# Patient Record
Sex: Female | Born: 1995 | Hispanic: No | Marital: Single | Smoking: Never smoker
Health system: Southern US, Community
[De-identification: ages and names within clinical notes are randomized; demographics above are authoritative.]

## PROBLEM LIST (undated history)

## (undated) DIAGNOSIS — E282 Polycystic ovarian syndrome: Secondary | ICD-10-CM

## (undated) HISTORY — PX: APPENDECTOMY: SHX54

## (undated) HISTORY — PX: TONSILLECTOMY: SUR1361

## (undated) HISTORY — PX: WISDOM TOOTH EXTRACTION: SHX21

---
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
## 2016-10-10 ENCOUNTER — Ambulatory Visit: Payer: Self-pay | Admitting: Psychology

## 2016-11-25 ENCOUNTER — Encounter (HOSPITAL_COMMUNITY): Payer: Self-pay | Admitting: Emergency Medicine

## 2016-11-25 ENCOUNTER — Emergency Department (HOSPITAL_COMMUNITY): Payer: Self-pay

## 2016-11-25 ENCOUNTER — Other Ambulatory Visit: Payer: Self-pay

## 2016-11-25 ENCOUNTER — Emergency Department (HOSPITAL_COMMUNITY)
Admission: EM | Admit: 2016-11-25 | Discharge: 2016-11-25 | Disposition: A | Discharge status: Home / Self Care | Payer: Self-pay | Attending: Emergency Medicine | Admitting: Emergency Medicine

## 2016-11-25 DIAGNOSIS — I951 Orthostatic hypotension: Secondary | ICD-10-CM

## 2016-11-25 DIAGNOSIS — R55 Syncope and collapse: Secondary | ICD-10-CM | POA: Insufficient documentation

## 2016-11-25 DIAGNOSIS — E860 Dehydration: Secondary | ICD-10-CM

## 2016-11-25 HISTORY — DX: Polycystic ovarian syndrome: E28.2

## 2016-11-25 LAB — URINALYSIS, ROUTINE W REFLEX MICROSCOPIC
Bilirubin Urine: NEGATIVE
Glucose, UA: NEGATIVE mg/dL
HGB URINE DIPSTICK: NEGATIVE
Ketones, ur: NEGATIVE mg/dL
LEUKOCYTES UA: NEGATIVE
Nitrite: NEGATIVE
Protein, ur: NEGATIVE mg/dL
SPECIFIC GRAVITY, URINE: 1.013 (ref 1.005–1.030)
pH: 6 (ref 5.0–8.0)

## 2016-11-25 LAB — CBC
HCT: 40.1 % (ref 36.0–46.0)
HEMOGLOBIN: 13.3 g/dL (ref 12.0–15.0)
MCH: 28.4 pg (ref 26.0–34.0)
MCHC: 33.2 g/dL (ref 30.0–36.0)
MCV: 85.7 fL (ref 78.0–100.0)
Platelets: 258 10*3/uL (ref 150–400)
RBC: 4.68 MIL/uL (ref 3.87–5.11)
RDW: 12.6 % (ref 11.5–15.5)
WBC: 8.9 10*3/uL (ref 4.0–10.5)

## 2016-11-25 LAB — BASIC METABOLIC PANEL
ANION GAP: 9 (ref 5–15)
BUN: 11 mg/dL (ref 6–20)
CALCIUM: 9.1 mg/dL (ref 8.9–10.3)
CHLORIDE: 104 mmol/L (ref 101–111)
CO2: 25 mmol/L (ref 22–32)
Creatinine, Ser: 0.72 mg/dL (ref 0.44–1.00)
GFR calc non Af Amer: >60 mL/min (ref >60)
Glucose, Bld: 96 mg/dL (ref 65–99)
Potassium: 4.1 mmol/L (ref 3.5–5.1)
Sodium: 138 mmol/L (ref 135–145)

## 2016-11-25 LAB — PREGNANCY, URINE: PREG TEST UR: NEGATIVE

## 2016-11-25 LAB — CBG MONITORING, ED: GLUCOSE-CAPILLARY: 88 mg/dL (ref 65–99)

## 2016-11-25 NOTE — ED Triage Notes (Signed)
To ED via [facility] with c/o syncopal episode at work-- witnessed -- hit head-- LOC for "short time" -- pt ambulatory to ED -- alert/oriented x 4.

## 2016-11-25 NOTE — ED Provider Notes (Addendum)
[facility] Provider Note   CSN: [id number] Arrival date & time: 11/25/16  0916     History   Chief Complaint Chief Complaint  Patient presents with  . Loss of Consciousness    HPI Tracey Hernandez is a 21 y.o. female.  The history is provided by the patient. No language interpreter was used.  Loss of Consciousness     Tracey Hernandez is a [age] y.o. female who presents to the Emergency Department complaining of syncope.  Work today when she began to feel unwell and passed out, falling to the ground and striking her head on the concrete. She states that she hit the ground hard and had very brief loss of consciousness. She reports a general feeling of unwellness as well as headache. She has had headaches over the last several days that are similar to her prior headaches but slightly more intense in nature. They have been significant enough for her to take ibuprofen at home. She denies any fevers, chest pain, shortness of breath, vomiting, leg swelling or pain. She does have a history of PCO S and has some mild lower abdominal cramping but she just finished her cycle and this is normal for her. She denies any other medical problems. She does take birth control. She did not eat. She was worked today, but this is typical for her. Past Medical History:  Diagnosis Date  . PCOS (polycystic ovarian syndrome)     There are no active problems to display for this patient.   Past Surgical History:  Procedure Laterality Date  . APPENDECTOMY    . TONSILLECTOMY      OB History    No data available       Home Medications    Prior to Admission medications   Not on File    Family History No family history on file.  Social History Social History  Substance Use Topics  . Smoking status: Never Smoker  . Smokeless tobacco: Never Used  . Alcohol use Yes     Comment: socially     Allergies   Patient has no allergy information on record.   Review of Systems Review of Systems    Cardiovascular: Positive for syncope.  All other systems reviewed and are negative.    Physical Exam Updated Vital Signs BP 134/78 (BP Location: Right Arm)   Pulse 64   Temp 98.2 F (36.8 C) (Oral)   Resp 16   Ht 5\' 4"  (1.626 m)   Wt 79.4 kg (175 lb)   LMP 11/24/2016   SpO2 100%   BMI 30.04 kg/m   Physical Exam  Constitutional: She is oriented to person, place, and time. She appears well-developed and well-nourished.  HENT:  Head: Normocephalic and atraumatic.  Eyes: Pupils are equal, round, and reactive to light. EOM are normal.  Cardiovascular: Normal rate and regular rhythm.   No murmur heard. Pulmonary/Chest: Effort normal and breath sounds normal. No respiratory distress.  Abdominal: Soft. There is no rebound and no guarding.  Mild suprapubic tenderness  Musculoskeletal: She exhibits no edema or tenderness.  Neurological: She is alert and oriented to person, place, and time. No cranial nerve deficit. Coordination normal.  5/5 strength in all four extremities.    Skin: Skin is warm and dry.  Psychiatric: She has a normal mood and affect. Her behavior is normal.  Nursing note and vitals reviewed.    ED Treatments / Results  Labs (all labs ordered are listed, but only abnormal results are displayed)  Labs Reviewed  BASIC METABOLIC PANEL  CBC  URINALYSIS, ROUTINE W REFLEX MICROSCOPIC  PREGNANCY, URINE  CBG MONITORING, ED    EKG  EKG Interpretation  Date/Time:  Monday November 25 2016 09:12:20 EDT Ventricular Rate:  66 PR Interval:  154 QRS Duration: 88 QT Interval:  416 QTC Calculation: 436 R Axis:   90 Text Interpretation:  Normal sinus rhythm with sinus arrhythmia Rightward axis Borderline ECG Confirmed by Tilden Fossa [phone]) on 11/25/2016 12:14:46 PM       Radiology Ct Head Wo Contrast  Result Date: 11/25/2016 CLINICAL DATA:  Headache posttraumatic.  Fall today. EXAM: CT HEAD WITHOUT CONTRAST TECHNIQUE: Contiguous axial images were obtained from  the base of the skull through the vertex without intravenous contrast. COMPARISON:  None. FINDINGS: Brain: No evidence of acute infarction, hemorrhage, hydrocephalus, extra-axial collection or mass lesion/mass effect. Vascular: No hyperdense vessel or unexpected calcification. Skull: Negative Sinuses/Orbits: Negative Other: None IMPRESSION: Normal CT head Electronically Signed   By: Marlan Palau M.D.   On: 11/25/2016 13:19    Procedures Procedures (including critical care time)  Medications Ordered in ED Medications - No data to display   Initial Impression / Assessment and Plan / ED Course  I have reviewed the triage vital signs and the nursing notes.  Pertinent labs & imaging results that were available during my care of the patient were reviewed by me and considered in my medical decision making (see chart for details).     Patient here for evaluation following a syncopal event. She is nontoxic appearing on examination with no focal neurologic deficits. CT head obtained given patient's history of new onset headaches as well as head injury today. Presentation is not consistent with subarachnoid hemorrhage, cardiogenic syncope, PE.   She is orthostatic in the department. She declines IV fluids at this time and prefers oral fluid hydration at home. She did not have any chest pain or shortness of breath with her orthostatic vital signs but did feel fatigued. Counseled patient on oral fluid hydration, rest.  Counseled patient on home care, outpatient follow up and return precautions.   Final Clinical Impressions(s) / ED Diagnoses   Final diagnoses:  Syncope and collapse    New Prescriptions New Prescriptions   No medications on file     Tilden Fossa, MD 11/25/16 1336    Tilden Fossa, MD 11/25/16 1353

## 2016-11-25 NOTE — ED Notes (Signed)
Pt verbalized understanding discharge instructions and denies any further needs or questions at this time. VS stable, ambulatory and steady gait.   

## 2016-12-11 ENCOUNTER — Encounter: Payer: Self-pay | Admitting: Physician Assistant

## 2016-12-11 ENCOUNTER — Ambulatory Visit: Payer: Worker's Compensation | Attending: Internal Medicine | Admitting: Physician Assistant

## 2016-12-11 VITALS — BP 103/62 | HR 65 | Temp 98.5°F | Wt 180.0 lb

## 2016-12-11 DIAGNOSIS — E559 Vitamin D deficiency, unspecified: Secondary | ICD-10-CM

## 2016-12-11 DIAGNOSIS — R059 Cough, unspecified: Secondary | ICD-10-CM

## 2016-12-11 DIAGNOSIS — R05 Cough: Secondary | ICD-10-CM | POA: Insufficient documentation

## 2016-12-11 DIAGNOSIS — E282 Polycystic ovarian syndrome: Secondary | ICD-10-CM | POA: Insufficient documentation

## 2016-12-11 MED ORDER — FLUCONAZOLE 150 MG PO TABS: 150.0000 mg | ORAL_TABLET | Freq: Once | ORAL | 0 refills | Status: AC

## 2016-12-11 MED ORDER — AZITHROMYCIN 250 MG PO TABS: ORAL_TABLET | ORAL | 0 refills | Status: DC

## 2016-12-11 MED FILL — AZITHROMYCIN 250 MG TABLET: 250 | 5 days supply | Qty: 6 | Fill #0

## 2016-12-11 MED FILL — FLUCONAZOLE 150 MG TABLET: 150 | 1 days supply | Qty: 1 | Fill #0

## 2016-12-11 NOTE — Progress Notes (Signed)
Patient ID: Tracey Hernandez, female   DOB: February 03, 1996, 21 y.o.   MRN: [id number]       Tracey Hernandez, is a 21 y.o. female  NWG:[id number]  [id number]  DOB - May 06, 1995  Subjective:  Chief Complaint and HPI: Tracey Hernandez is a 21 y.o. female here today to establish care and for a follow up visit After being seen in the ED 11/25/2016 for syncope.  EKG did not show acute ischemia.    CBC, BMP, glucose and UA WNL.  Pregnancy was negative.    Today she presents without further episodes and has been feeling fine other than a persistent cough that developed after her ED visit.  Cough is non-productive and worse at night.  She feels fine otherwise. No f/c  She takes OCP for PCOS.   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  Had work-up at another clinic about 2-3 months ago.  TSH was normal.  Vitamin D was slightly low.    From ED note: Tracey Hernandez is a 21 y.o. female who presents to the Emergency Department complaining of syncope.  Work today when she began to feel unwell and passed out, falling to the ground and striking her head on the concrete. She states that she hit the ground hard and had very brief loss of consciousness. She reports a general feeling of unwellness as well as headache. She has had headaches over the last several days that are similar to her prior headaches but slightly more intense in nature. They have been significant enough for her to take ibuprofen at home. She denies any fevers, chest pain, shortness of breath, vomiting, leg swelling or pain. She does have a history of PCO S and has some mild lower abdominal cramping but she just finished her cycle and this is normal for her. She denies any other medical problems. She does take birth control. She did not eat. She was worked today, but this is typical for her.  CT head obtained given patient's history of new onset headaches as well as head injury today. Presentation is not consistent with subarachnoid  hemorrhage, cardiogenic syncope, PE.   She was orthostatic but refused IVF. Marland Kitchen   ED/Hospital notes reviewed.   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 EENT:  No vision changes, No blurry vision, No hearing changes. No mouth, throat, or ear problems.  Respiratory: No cough, No SOB Cardiac: No CP, no palpitations GI:  No abd pain, No N/V/D. GU: No Urinary s/sx Musculoskeletal: No joint pain Neuro: No headache, no dizziness, no motor weakness.  Skin: No rash Endocrine:  No polydipsia. No polyuria.  Psych: Denies SI/HI  No problems updated.  ALLERGIES: No Known Allergies  PAST MEDICAL HISTORY: Past Medical History:  Diagnosis Date  . PCOS (polycystic ovarian syndrome)     MEDICATIONS AT HOME: Prior to Admission medications   Medication Sig Start Date End Date Taking? Authorizing Provider  azithromycin (ZITHROMAX) 250 MG tablet Take 2 tabs day then 1 tab daily 12/11/16   Georgian Co M, PA-C  fluconazole (DIFLUCAN) 150 MG tablet Take 1 tablet (150 mg total) by mouth once. 12/11/16 12/11/16  Anders Simmonds, PA-C     Objective:  EXAM:   Vitals:   12/11/16 1507  BP: 103/62  Pulse: 65  Temp: 98.5 F (36.9 C)  TempSrc: Oral  Weight: 180 lb (81.6  kg)    General appearance : A&OX3. NAD. Non-toxic-appearing HEENT: Atraumatic and Normocephalic.  PERRLA. EOM intact.  TM clear B. Mouth-MMM, post pharynx WNL w/o erythema, No PND. Neck: supple, no JVD. No cervical lymphadenopathy. No thyromegaly Chest/Lungs:  Breathing-non-labored, Good air entry bilaterally, breath sounds normal without rales, rhonchi, or wheezing  CVS: S1 S2 regular, no murmurs, gallops, rubs  Extremities: Bilateral Lower Ext shows no edema, both legs are warm to touch with = pulse throughout Neurology:  CN II-XII grossly intact, Non focal.   Psych:  TP linear. J/I WNL. Normal speech. Appropriate eye contact and  affect.  Skin:  No Rash  Data Review No results found for: HGBA1C   Assessment & Plan   1. Cough Cover for atypicals due to length of cough - azithromycin (ZITHROMAX) 250 MG tablet; Take 2 tabs day then 1 tab daily  Dispense: 6 tablet; Refill: 0 - fluconazole (DIFLUCAN) 150 MG tablet; Take 1 tablet (150 mg total) by mouth once.  Dispense: 1 tablet; Refill: 0  2. Vitamin D deficiency 2000 units vitamin D daily Recheck level in 10 weeks (level was checked elsewhere a couple of months ago and was reported as "low."  She was advised to get more sun.     Patient have been counseled extensively about nutrition and exercise  Return in about 10 weeks (around 02/19/2017) for assign PCP; check vitamin d level.  The patient was given clear instructions to go to ER or return to medical center if symptoms don't improve, worsen or new problems develop. The patient verbalized understanding. The patient was told to call to get lab results if they haven't heard anything in the next week.     Georgian Co, PA-C [facility] [place], [place] [phone]   12/11/2016, 3:18 PM

## 2016-12-11 NOTE — Patient Instructions (Signed)
Vitamin D 2000 units daily.

## 2016-12-27 ENCOUNTER — Ambulatory Visit (HOSPITAL_COMMUNITY)
Admission: EM | Admit: 2016-12-27 | Discharge: 2016-12-27 | Disposition: A | Discharge status: Home / Self Care | Payer: Self-pay | Attending: Family | Admitting: Family

## 2016-12-27 ENCOUNTER — Encounter (HOSPITAL_COMMUNITY): Payer: Self-pay | Admitting: Family Medicine

## 2016-12-27 DIAGNOSIS — R05 Cough: Secondary | ICD-10-CM | POA: Insufficient documentation

## 2016-12-27 DIAGNOSIS — J069 Acute upper respiratory infection, unspecified: Secondary | ICD-10-CM

## 2016-12-27 DIAGNOSIS — B9789 Other viral agents as the cause of diseases classified elsewhere: Secondary | ICD-10-CM

## 2016-12-27 DIAGNOSIS — R0981 Nasal congestion: Secondary | ICD-10-CM

## 2016-12-27 DIAGNOSIS — E282 Polycystic ovarian syndrome: Secondary | ICD-10-CM | POA: Insufficient documentation

## 2016-12-27 DIAGNOSIS — H9203 Otalgia, bilateral: Secondary | ICD-10-CM | POA: Insufficient documentation

## 2016-12-27 DIAGNOSIS — J029 Acute pharyngitis, unspecified: Secondary | ICD-10-CM | POA: Insufficient documentation

## 2016-12-27 MED ORDER — BENZONATATE 100 MG PO CAPS: 100.0000 mg | ORAL_CAPSULE | Freq: Two times a day (BID) | ORAL | 0 refills | Status: DC

## 2016-12-27 NOTE — Discharge Instructions (Signed)
Suspect viral respiratory infection  Mucinex as discussed  Increase intake of clear fluids. Congestion is best treated by hydration, when mucus is wetter, it is thinner, less sticky, and easier to expel from the body, either through coughing up drainage, or by blowing your nose.   Get plenty of rest.   Use saline nasal drops and blow your nose frequently. Run a humidifier at night and elevate the head of the bed. Vicks Vapor rub will help with congestion and cough. Steam showers and sinus massage for congestion.   Use Acetaminophen or Ibuprofen as needed for fever or pain. Avoid second hand smoke. Even the smallest exposure will worsen symptoms.   Over the counter medications you can try include Delsym for cough, a decongestant for congestion, and Mucinex or Robitussin as an expectorant. Be sure to just get the plain Mucinex or Robitussin that just has one medication (Guaifenesen). We don't recommend the combination products. Note, be sure to drink two glasses of water with each dose of Mucinex as the medication will not work well without adequate hydration.   You can also try a teaspoon of honey to see if this will help reduce cough. Throat lozenges can sometimes be beneficial as well.    This illness will typically last 7 - 10 days.   Please follow up with our clinic if you develop a fever greater than 101 F, symptoms worsen, or do not resolve in the next week.

## 2016-12-27 NOTE — ED Provider Notes (Signed)
[facility]    CSN: [id number] Arrival date & time: 12/27/16  1557     History   Chief Complaint Chief Complaint  Patient presents with  . Sore Throat  . Cough  . Headache    HPI Tracey Hernandez is a 21 y.o. female.   CC: sore throat, cough x 3 days, unchanged,   Endorses headache, sinus pain, bilateral ear pain. Feels SOB during bad coughing spell. No SOB right now.  Tried ibuprofen with some relief. Describes HA as pressure, 'not worst HA of life.'   No Fever, chills, wheezing, CP  No sick contacts, recent travel.   No asthma. Non smoker  Works in [employer].        8/22 seen at [facility] for cough and given azithromycin, with resolve.   Past Medical History:  Diagnosis Date  . PCOS (polycystic ovarian syndrome)     Patient Active Problem List   Diagnosis Date Noted  . PCOS (polycystic ovarian syndrome) 07/05/2011    Past Surgical History:  Procedure Laterality Date  . APPENDECTOMY    . TONSILLECTOMY      OB History    No data available       Home Medications    Prior to Admission medications   Medication Sig Start Date End Date Taking? Authorizing Provider  azithromycin (ZITHROMAX) 250 MG tablet Take 2 tabs day then 1 tab daily 12/11/16   Georgian Co M, PA-C  benzonatate (TESSALON PERLES) 100 MG capsule Take 1 capsule (100 mg total) by mouth 2 (two) times daily. 12/27/16   Allegra Grana, FNP    Family History History reviewed. No pertinent family history.  Social History Social History  Substance Use Topics  . Smoking status: Never Smoker  . Smokeless tobacco: Never Used  . Alcohol use Yes     Comment: socially     Allergies   Patient has no known allergies.   Review of Systems Review of Systems  Constitutional: Negative for chills and fever.  HENT: Positive for sore throat.   Respiratory: Positive for cough.   Cardiovascular: Negative for chest pain and palpitations.  Gastrointestinal: Negative for nausea  and vomiting.  Neurological: Positive for headaches.     Physical Exam Triage Vital Signs ED Triage Vitals  Enc Vitals Group     BP 12/27/16 1640 121/66     Pulse Rate 12/27/16 1640 60     Resp 12/27/16 1640 18     Temp 12/27/16 1640 98.5 F (36.9 C)     Temp src --      SpO2 12/27/16 1640 100 %     Weight --      Height --      Head Circumference --      Peak Flow --      Pain Score 12/27/16 1641 6     Pain Loc --      Pain Edu? --      Excl. in [facility]? --    No data found.   Updated Vital Signs BP 121/66   Pulse 60   Temp 98.5 F (36.9 C)   Resp 18   SpO2 100%   Visual Acuity Right Eye Distance:   Left Eye Distance:   Bilateral Distance:    Right Eye Near:   Left Eye Near:    Bilateral Near:     Physical Exam  Constitutional: She appears well-developed and well-nourished.  HENT:  Head: Normocephalic and atraumatic.  Right Ear: Hearing, tympanic  membrane, external ear and ear canal normal. No drainage, swelling or tenderness. No foreign bodies. Tympanic membrane is not erythematous and not bulging. No middle ear effusion. No decreased hearing is noted.  Left Ear: Hearing, tympanic membrane, external ear and ear canal normal. No drainage, swelling or tenderness. No foreign bodies. Tympanic membrane is not erythematous and not bulging.  No middle ear effusion. No decreased hearing is noted.  Nose: Nose normal. No rhinorrhea. Right sinus exhibits no maxillary sinus tenderness and no frontal sinus tenderness. Left sinus exhibits no maxillary sinus tenderness and no frontal sinus tenderness.  Mouth/Throat: Uvula is midline, oropharynx is clear and moist and mucous membranes are normal. No oropharyngeal exudate, posterior oropharyngeal edema, posterior oropharyngeal erythema or tonsillar abscesses.  Eyes: Conjunctivae are normal.  Cardiovascular: Regular rhythm, normal heart sounds and normal pulses.   Pulmonary/Chest: Effort normal and breath sounds normal. She has no  wheezes. She has no rhonchi. She has no rales.  Lymphadenopathy:       Head (right side): No submental, no submandibular, no tonsillar, no preauricular, no posterior auricular and no occipital adenopathy present.       Head (left side): No submental, no submandibular, no tonsillar, no preauricular, no posterior auricular and no occipital adenopathy present.    She has no cervical adenopathy.  Neurological: She is alert.  Skin: Skin is warm and dry.  Psychiatric: She has a normal mood and affect. Her speech is normal and behavior is normal. Thought content normal.  Vitals reviewed.    UC Treatments / Results  Labs (all labs ordered are listed, but only abnormal results are displayed) Labs Reviewed - No data to display  EKG  EKG Interpretation None       Radiology No results found.  Procedures Procedures (including critical care time)  Medications Ordered in UC Medications - No data to display   Initial Impression / Assessment and Plan / UC Course  I have reviewed the triage vital signs and the nursing notes.  Pertinent labs & imaging results that were available during my care of the patient were reviewed by me and considered in my medical decision making (see chart for details).       Final Clinical Impressions(s) / UC Diagnoses   Final diagnoses:  Sinus congestion  Viral URI with cough   Working diagnosis of viral URI x 3 days. Well appearing. No acute respiratory distress. Afebrile. Strep negative.Patient and I agreed upon conservative therapy at this time with symptom management , close observation, and delayed antibiotic treatment. Advised mucinex, tessalon PRN.   Return precautions given.    New Prescriptions New Prescriptions   BENZONATATE (TESSALON PERLES) 100 MG CAPSULE    Take 1 capsule (100 mg total) by mouth 2 (two) times daily.     Controlled Substance Prescriptions [place] Controlled Substance Registry consulted? Not Applicable   Allegra Granarnett, Arlana Canizales  G, FNP 12/27/16 1758

## 2016-12-30 LAB — CULTURE, GROUP A STREP (THRC)

## 2017-01-02 ENCOUNTER — Encounter (HOSPITAL_COMMUNITY): Payer: Self-pay | Admitting: Family Medicine

## 2017-01-02 ENCOUNTER — Ambulatory Visit (HOSPITAL_COMMUNITY)
Admission: EM | Admit: 2017-01-02 | Discharge: 2017-01-02 | Disposition: A | Discharge status: Home / Self Care | Payer: Self-pay | Attending: Family Medicine | Admitting: Family Medicine

## 2017-01-02 ENCOUNTER — Ambulatory Visit (INDEPENDENT_AMBULATORY_CARE_PROVIDER_SITE_OTHER): Payer: Self-pay

## 2017-01-02 DIAGNOSIS — R05 Cough: Secondary | ICD-10-CM

## 2017-01-02 DIAGNOSIS — R059 Cough, unspecified: Secondary | ICD-10-CM

## 2017-01-02 MED ORDER — PROMETHAZINE-DM 6.25-15 MG/5ML PO SYRP: 5.0000 mL | ORAL_SOLUTION | Freq: Four times a day (QID) | ORAL | 0 refills | Status: DC | PRN

## 2017-01-02 NOTE — ED Triage Notes (Signed)
Pt here for URI sputums. sts she was seen here last week and  Her congestion is better but still has a hacking cough.

## 2017-01-02 NOTE — ED Provider Notes (Signed)
  [facility]   [id number] 01/02/17 Arrival Time: 1312  ASSESSMENT & PLAN:  1. Cough     Meds ordered this encounter  Medications  . promethazine-dextromethorphan (PROMETHAZINE-DM) 6.25-15 MG/5ML syrup    Sig: Take 5 mLs by mouth 4 (four) times daily as needed for cough.    Dispense:  118 mL    Refill:  0    OTC analgesics and symptom care as needed. Will f/u in 1-2 weeks if not improving. Question allergy related. Does reports sneezing more. Occasional itchy eyes. Will try OTC allergy medication in addition.  Reviewed expectations re: course of current medical issues. Questions answered. Outlined signs and symptoms indicating need for more acute intervention. Patient verbalized understanding. After Visit Summary given.   SUBJECTIVE:  Tracey Hernandez is a 21 y.o. female who presents with complaint of persistent cough. Cold symptoms on/off for a couple of weeks. Treated with Zithromax and Tessalon Perles. Now with hacking cough, worse at night. Affecting sleep. Nausea associated with continued coughing. Slightly decreased PO intake. No emesis. Afebrile. Non-smoker. No OTC treatment. No specific aggravating or alleviating factors reported.  ROS: As per HPI.   OBJECTIVE:  Vitals:   01/02/17 1421  BP: 122/84  Pulse: 60  Resp: 18  Temp: 98.6 F (37 C)  SpO2: 98%     General appearance: alert; no distress HEENT: nasal congestion, mild Neck: supple without LAD Lungs: clear to auscultation bilaterally; dry cough Skin: warm and dry Psychological: alert and cooperative; normal mood and affect   Dg Chest 2 View  Result Date: 01/02/2017 CLINICAL DATA:  Cough and congestion, fever for 3-7 days. History of pneumonia and bronchitis. EXAM: CHEST  2 VIEW COMPARISON:  None. FINDINGS: Cardiomediastinal silhouette is normal. No pleural effusions or focal consolidations. Trachea projects midline and there is no pneumothorax. Soft tissue planes and included osseous structures are  non-suspicious. IMPRESSION: Normal chest. Electronically Signed   By: Awilda Metroourtnay  Bloomer M.D.   On: 01/02/2017 15:01    No Known Allergies  Past Medical History:  Diagnosis Date  . PCOS (polycystic ovarian syndrome)    Social History   Social History  . Marital status: Single    Spouse name: N/A  . Number of children: N/A  . Years of education: N/A   Occupational History  . Not on file.   Social History Main Topics  . Smoking status: Never Smoker  . Smokeless tobacco: Never Used  . Alcohol use Yes     Comment: socially  . Drug use: No  . Sexual activity: Not on file   Other Topics Concern  . Not on file   Social History Narrative  . No narrative on file           Mardella LaymanHagler, [name], MD 01/02/17 [id number]

## 2017-03-17 ENCOUNTER — Encounter: Payer: Self-pay | Admitting: Family Medicine

## 2017-03-17 ENCOUNTER — Ambulatory Visit: Payer: Self-pay | Attending: Family Medicine | Admitting: Family Medicine

## 2017-03-17 ENCOUNTER — Ambulatory Visit: Payer: Medicaid Other | Attending: Internal Medicine | Admitting: Licensed Clinical Social Worker

## 2017-03-17 VITALS — BP 105/64 | HR 67 | Temp 98.7°F | Resp 18 | Ht 64.0 in | Wt 179.6 lb

## 2017-03-17 DIAGNOSIS — F329 Major depressive disorder, single episode, unspecified: Secondary | ICD-10-CM | POA: Insufficient documentation

## 2017-03-17 DIAGNOSIS — E559 Vitamin D deficiency, unspecified: Secondary | ICD-10-CM

## 2017-03-17 DIAGNOSIS — Z8659 Personal history of other mental and behavioral disorders: Secondary | ICD-10-CM

## 2017-03-17 DIAGNOSIS — L42 Pityriasis rosea: Secondary | ICD-10-CM

## 2017-03-17 DIAGNOSIS — F4310 Post-traumatic stress disorder, unspecified: Secondary | ICD-10-CM | POA: Insufficient documentation

## 2017-03-17 DIAGNOSIS — Z09 Encounter for follow-up examination after completed treatment for conditions other than malignant neoplasm: Secondary | ICD-10-CM

## 2017-03-17 DIAGNOSIS — F419 Anxiety disorder, unspecified: Secondary | ICD-10-CM | POA: Insufficient documentation

## 2017-03-17 DIAGNOSIS — F339 Major depressive disorder, recurrent, unspecified: Secondary | ICD-10-CM

## 2017-03-17 MED ORDER — HYDROXYZINE HCL 25 MG PO TABS: 25.0000 mg | ORAL_TABLET | Freq: Three times a day (TID) | ORAL | 0 refills | Status: DC | PRN

## 2017-03-17 MED ORDER — TRIAMCINOLONE ACETONIDE 0.025 % EX OINT: 1.0000 | TOPICAL_OINTMENT | Freq: Two times a day (BID) | CUTANEOUS | 1 refills | Status: DC

## 2017-03-17 NOTE — BH Specialist Note (Signed)
Integrated Behavioral Health Initial Visit  MRN: [id number] Name: Tracey Hernandez Fedele  Number of Integrated Behavioral Health Clinician visits:: 1/6 Session Start time: 4:15 PM  Session End time: 4:45 PM Total time: 30 minutes  Type of Service: Integrated Behavioral Health- Individual/Family Interpretor:No. Interpretor Name and Language: N.A.   Warm Hand Off Completed.       SUBJECTIVE: Tracey Hernandez is a 21 y.o. female accompanied by self Patient was referred by FNP Hairston for depression and anxiety. Patient reports the following symptoms/concerns: overwhelming feelings of sadness and worry, difficulty sleeping, low energy, decreased concentration, irritability, decreased appetite, panic attacks, and hx of suicidal ideations Duration of problem: Ongoing Pt reports 14 years; Severity of problem: severe  OBJECTIVE: Mood: Dysphoric and Affect: Depressed Risk of harm to self or others: No plan to harm self or others Pt has hx of suicidal ideations; however, denies current SI/HI/AVH.   LIFE CONTEXT: Family and Social: Pt receives support from partner. She states that she is trying to become more social and plays on a Quidditch team (currently off season) School/Work: Pt is a full time Consulting civil engineerstudent at [employer]. She has a P/T job Self-Care: Pt participated in a Friendsgiving celebration. She states that she drinks alcohol socially, denies any additional substance use Life Changes: Pt experiences panic attacks 2-3 x weekly. She has history of severe anxiety and depression  GOALS ADDRESSED: Patient will: 1. Reduce symptoms of: anxiety and depression 2. Increase knowledge and/or ability of: coping skills  3. Demonstrate ability to: Increase adequate support systems for patient/family and Increase motivation to adhere to plan of care  INTERVENTIONS: Interventions utilized: Mindfulness or Relaxation Training and Supportive Counseling  Standardized Assessments completed: GAD-7 and PHQ 2&9 with  C-SSRS  ASSESSMENT: Patient currently experiencing anxiety and depression. She reports overwhelming feelings of sadness and worry, difficulty sleeping, low energy, decreased concentration, irritability, decreased appetite, panic attacks, and hx of suicidal ideations. She denies current SI/HI/AVH. Pt identified protective factors and was provided a safety plan to complete in case of crisis. She receives emotional support from partner.   [facility]. She has missed two appointments this month. LCSWA discussed benefits of application of mindfulness interventions and taught pt grounding exercises to decrease symptoms. Pt was provided resources on emotional well-being applications and crisis intervention.  PLAN: 1. Follow up with behavioral health clinician on : Pt was encouraged to contact LCSWA if symptoms worsen or fail to improve to [facility] 2. Behavioral recommendations: LCSWA recommends that pt apply healthy coping skills discussed, continue to comply with psychotherapy, and utilize provided resources. Pt is encouraged to schedule follow up appointment with LCSWA 3. Referral(s): Community Mental Health Services (LME/Outside Clinic) 4. "From scale of 1-10, how likely are you to follow plan?": 7/10  Bridgett LarssonJasmine D Lewis, LCSW 03/19/17 4:22 PM

## 2017-03-17 NOTE — Progress Notes (Deleted)
   Subjective:  Patient ID: Genevive BiMindy Lienhard, female    DOB: Dec 05, 1995  Age: 21 y.o. MRN: [id number]  CC: Establish Care   HPI Noele Twaddell presents for        Outpatient Medications Prior to Visit  Medication Sig Dispense Refill  . azithromycin (ZITHROMAX) 250 MG tablet Take 2 tabs day then 1 tab daily 6 tablet 0  . benzonatate (TESSALON PERLES) 100 MG capsule Take 1 capsule (100 mg total) by mouth 2 (two) times daily. 30 capsule 0  . promethazine-dextromethorphan (PROMETHAZINE-DM) 6.25-15 MG/5ML syrup Take 5 mLs by mouth 4 (four) times daily as needed for cough. 118 mL 0   No facility-administered medications prior to visit.     ROS Review of Systems  Review of Systems - {ros [id number]}    Objective:  BP 105/64 (BP Location: Left Arm, Patient Position: Sitting, Cuff Size: Normal)   Pulse 67   Temp 98.7 F (37.1 C) (Oral)   Resp 18   Ht 5\' 4"  (1.626 m)   Wt 179 lb 9.6 oz (81.5 kg)   SpO2 98%   BMI 30.83 kg/m   BP/Weight 03/17/2017 01/02/2017 12/27/2016  Systolic BP 105 122 121  Diastolic BP 64 84 66  Wt. (Lbs) 179.6 - -  BMI 30.83 - -     Physical Exam   Assessment & Plan:   Problem List Items Addressed This Visit    None      No orders of the defined types were placed in this encounter.   Follow-up: No Follow-up on file.   Lizbeth BarkMandesia R Esten Dollar FNP

## 2017-03-17 NOTE — Patient Instructions (Signed)
Pityriasis Rosea Pityriasis rosea is a rash that usually appears on the trunk of the body. It may also appear on the upper arms and upper legs. It usually begins as a single patch, and then more patches begin to develop. The rash may cause mild itching, but it normally does not cause other problems. It usually goes away without treatment. However, it may take weeks or months for the rash to go away completely. What are the causes? The cause of this condition is not known. The condition does not spread from person to person (is noncontagious). What increases the risk? This condition is more likely to develop in young adults and children. It is most common in the spring and fall. What are the signs or symptoms? The main symptom of this condition is a rash.  The rash usually begins with a single oval patch that is larger than the ones that follow. This is called a herald patch. It generally appears a week or more before the rest of the rash appears.  When more patches start to develop, they spread quickly on the trunk, back, and arms. These patches are smaller than the first one.  The patches that make up the rash are usually oval-shaped and pink or red in color. They are usually flat, but they may sometimes be raised so that they can be felt with a finger. They may also be finely crinkled and have a scaly ring around the edge.  The rash does not typically appear on areas of the skin that are exposed to the sun.  Most people who have this condition do not have other symptoms, but some have mild itching. In a few cases, a mild headache or body aches may occur before the rash appears and then go away. How is this diagnosed? Your health care provider may diagnose this condition by doing a physical exam and taking your medical history. To rule out other possible causes for the rash, the health care provider may order blood tests or take a skin sample from the rash to be looked at under a microscope. How  is this treated? Usually, treatment is not needed for this condition. The rash will probably go away on its own in 4-8 weeks. In some cases, a health care provider may recommend or prescribe medicine to reduce itching. Follow these instructions at home:  Take medicines only as directed by your health care provider.  Avoid scratching the affected areas of skin.  Do not take hot baths or use a sauna. Use only warm water when bathing or showering. Heat can increase itching. Contact a health care provider if:  Your rash does not go away in 8 weeks.  Your rash gets much worse.  You have a fever.  You have swelling or pain in the rash area.  You have fluid, blood, or pus coming from the rash area. This information is not intended to replace advice given to you by your health care provider. Make sure you discuss any questions you have with your health care provider. Document Released: 05/15/2001 Document Revised: 09/14/2015 Document Reviewed: 03/16/2014 Elsevier Interactive Patient Education  2018 Elsevier Inc.  

## 2017-03-17 NOTE — Progress Notes (Signed)
Patient is here for establish care   Patient complains about spot itchy  around body & chest

## 2017-03-18 LAB — VITAMIN D 25 HYDROXY (VIT D DEFICIENCY, FRACTURES): Vit D, 25-Hydroxy: 23.1 ng/mL — ABNORMAL LOW (ref 30.0–100.0)

## 2017-03-19 NOTE — Progress Notes (Signed)
Subjective:  Patient ID: Tracey BiMindy Mobley, female    DOB: [birth year]  Age: 21 y.o. MRN: [id number]  CC: Establish Care   HPI Tracey Hernandez presents for anxiety and depression follow-up.  She reports history of severe anxiety and depression as well as PTSD.    Symptoms include anhedonia, depressed mood, racing thoughts, and difficulty concentrating. She reports receiving professional treatment for symptoms for 14 years.  She has therapist and reports meeting with therapist weekly for cognitive processing therapy sessions.  She reports missing her appointments for the last 2 weeks. She is currently not on any medication.  She reports taking antidepressants including Zoloft and Prozac in the past but discontinued almost 2 years ago.  She denies any family history of mental health disorder.  She denies any current suicidal or homicidal ideations.  Symptoms stable. Rash: Onset 1 month ago.  Location right lateral breast. She reports being evaluated and treated with nystatin cream for initial symptoms, with minimal relief of symptoms. Patient reports change in distribution.  Rash has now spread to anterior chest and posterior back as well as bilateral upper thigh area.  Associated symptoms include pruritus and dryness.  She denies any fever, drainage, or contacts with similar symptoms.   Outpatient Medications Prior to Visit  Medication Sig Dispense Refill  . norethindrone-ethinyl estradiol-iron (MICROGESTIN FE,GILDESS FE,LOESTRIN FE) 1.5-30 MG-MCG tablet Take 1 tablet by mouth daily.    Marland Kitchen. azithromycin (ZITHROMAX) 250 MG tablet Take 2 tabs day then 1 tab daily 6 tablet 0  . benzonatate (TESSALON PERLES) 100 MG capsule Take 1 capsule (100 mg total) by mouth 2 (two) times daily. 30 capsule 0  . promethazine-dextromethorphan (PROMETHAZINE-DM) 6.25-15 MG/5ML syrup Take 5 mLs by mouth 4 (four) times daily as needed for cough. 118 mL 0   No facility-administered medications prior to visit.     ROS Review of  Systems  Constitutional: Negative.   Respiratory: Negative.   Cardiovascular: Negative.   Skin: Positive for rash.  Psychiatric/Behavioral: Positive for dysphoric mood (History of major depression). Negative for suicidal ideas. The patient is nervous/anxious (History of anxiety disorder/PTSD).         Objective:  BP 105/64 (BP Location: Left Arm, Patient Position: Sitting, Cuff Size: Normal)   Pulse 67   Temp 98.7 F (37.1 C) (Oral)   Resp 18   Ht 5\' 4"  (1.626 m)   Wt 179 lb 9.6 oz (81.5 kg)   SpO2 98%   BMI 30.83 kg/m   BP/Weight 03/17/2017 01/02/2017 12/27/2016  Systolic BP 105 122 121  Diastolic BP 64 84 66  Wt. (Lbs) 179.6 - -  BMI 30.83 - -     Physical Exam  Constitutional: She appears well-developed and well-nourished.  HENT:  Head: Normocephalic and atraumatic.  Right Ear: External ear normal.  Left Ear: External ear normal.  Nose: Nose normal.  Mouth/Throat: Oropharynx is clear and moist.  Eyes: Conjunctivae are normal. Pupils are equal, round, and reactive to light.  Neck: No JVD present.  Cardiovascular: Normal rate, regular rhythm, normal heart sounds and intact distal pulses.  Pulmonary/Chest: Effort normal and breath sounds normal.  Abdominal: Soft. Bowel sounds are normal.  Skin: Skin is warm and dry. Rash (Generalized rash to posterior back, anterior chest.  Present largest area to right lateral breast.) noted. Rash is maculopapular.  Psychiatric: Her mood appears anxious. She is slowed. She expresses no homicidal and no suicidal ideation. She expresses no suicidal plans and no homicidal plans.  Nursing note and  vitals reviewed.     Dep[facility] screen [facility] 2/9 03/17/2017 12/11/2016  Decreased Interest 2 1  Down, Depressed, Hopeless 1 1  PHQ - 2 Score 3 2  Altered sleeping 3 3  Tired, decreased energy 3 2  Change in appetite 2 1  Feeling bad or failure about yourself  2 1  Trouble concentrating 3 2  Moving slowly or fidgety/restless 1 3  Suicidal  thoughts 1 1  PHQ-9 Score 18 15    GAD 7 : Generalized Anxiety Score 03/17/2017 12/11/2016  Nervous, Anxious, on Edge 3 2  Control/stop worrying 2 1  Worry too much - different things 2 1  Trouble relaxing 2 2  Restless 1 1  Easily annoyed or irritable 2 1  Afraid - awful might happen 1 2  Total GAD 7 Score 13 10       Assessment & Plan:   1. Pityriasis rosea  - hydrOXYzine (ATARAX/VISTARIL) 25 MG tablet; Take 1 tablet (25 mg total) by mouth every 8 (eight) hours as needed for itching.  Dispense: 30 tablet; Refill: 0 - triamcinolone (KENALOG) 0.025 % ointment; Apply 1 application topically 2 (two) times daily.  Dispense: 30 g; Refill: 1  2. History of major depression  - Ambulatory referral to Psychiatry  3. History of anxiety  - Ambulatory referral to Psychiatry  4. Follow up   5. Vitamin D deficiency  - Vitamin D, 25-hydroxy     Follow-up: Return if symptoms worsen or fail to improve.   Lizbeth BarkMandesia R Makita Blow FNP

## 2017-03-24 ENCOUNTER — Other Ambulatory Visit: Payer: Self-pay | Admitting: Family Medicine

## 2017-03-24 ENCOUNTER — Telehealth: Payer: Self-pay

## 2017-03-24 DIAGNOSIS — E559 Vitamin D deficiency, unspecified: Secondary | ICD-10-CM

## 2017-03-24 MED ORDER — VITAMIN D (ERGOCALCIFEROL) 1.25 MG (50000 UNIT) PO CAPS: ORAL_CAPSULE | ORAL | 0 refills | Status: AC

## 2017-03-24 NOTE — Telephone Encounter (Signed)
-----   Message from Lizbeth BarkMandesia R Hairston, FNP sent at 03/24/2017  8:47 AM EST ----- Vitamin D level was low. Vitamin D helps to keep bones strong. You were prescribed ergocalciferol (capsules) to increase your vitamin-d level. Once finished start taking OTC vitamin d supplement with 400 to 800  international units (IU) of vitamin-d per day.

## 2017-03-24 NOTE — Telephone Encounter (Signed)
CMA call regarding lab results   Patient did not answer but left a detailed message & to call back if have any questions  

## 2017-05-14 ENCOUNTER — Ambulatory Visit (INDEPENDENT_AMBULATORY_CARE_PROVIDER_SITE_OTHER): Payer: BLUE CROSS/BLUE SHIELD | Admitting: Family Medicine

## 2017-05-14 ENCOUNTER — Other Ambulatory Visit: Payer: Self-pay

## 2017-05-14 ENCOUNTER — Encounter: Payer: Self-pay | Admitting: Family Medicine

## 2017-05-14 VITALS — BP 125/80 | HR 55 | Temp 98.5°F | Ht 64.0 in | Wt 184.0 lb

## 2017-05-14 DIAGNOSIS — Z0000 Encounter for general adult medical examination without abnormal findings: Secondary | ICD-10-CM | POA: Diagnosis not present

## 2017-05-14 DIAGNOSIS — L708 Other acne: Secondary | ICD-10-CM

## 2017-05-14 DIAGNOSIS — F411 Generalized anxiety disorder: Secondary | ICD-10-CM | POA: Diagnosis not present

## 2017-05-14 DIAGNOSIS — F32A Depression, unspecified: Secondary | ICD-10-CM | POA: Insufficient documentation

## 2017-05-14 DIAGNOSIS — E282 Polycystic ovarian syndrome: Secondary | ICD-10-CM

## 2017-05-14 DIAGNOSIS — F419 Anxiety disorder, unspecified: Secondary | ICD-10-CM | POA: Insufficient documentation

## 2017-05-14 DIAGNOSIS — F329 Major depressive disorder, single episode, unspecified: Secondary | ICD-10-CM

## 2017-05-14 MED ORDER — NORETHIN ACE-ETH ESTRAD-FE 1.5-30 MG-MCG PO TABS: 1.0000 | ORAL_TABLET | Freq: Every day | ORAL | 11 refills | Status: DC

## 2017-05-14 MED ORDER — BUPROPION HCL ER (SR) 100 MG PO TB12: 100.0000 mg | ORAL_TABLET | Freq: Two times a day (BID) | ORAL | Status: DC

## 2017-05-14 NOTE — Patient Instructions (Addendum)
Thank you for coming to see me today. It was a pleasure! Today we talked about:   Your depression. We will start you on Wellbutrin today. Please do not hesitate to call if you develop worsening symptoms or thoughts of suicide. I would like to see you in 6-8 weeks to see how you are doing. I have refilled your Blisovi, but you are due for a Pap Smear. We can do this at your next appointment.   Please follow-up with me in 6-8 weeks.  If you have any questions or concerns, please do not hesitate to call the office at [phone].  Take Care,   [place] Patrecia Veiga, DO

## 2017-05-14 NOTE — Progress Notes (Signed)
Subjective:    Patient ID: Genevive Bi, female    DOB: 1996/03/14, 22 y.o.   MRN: [id number]   [id number] care   HPI:  Depression/ Anxiety/ PTSD: - Patietn currently seeing a graduated student at [employer] for counseling - Reports that's he does not believe counseling alone is helping - Previously has taken Zoloft and Prozac- she does not know if either helped, but both gave her some suicidal ideations - Denies any thoughts or plans to hurt herself or other today - Reports she would like to try mediations again and get help from some other location if possible with someone with more experience, but she plans to continue going to her current counselor until she can get placed elsewhere - 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 Healthcare maintenance 05/18/2017  . Depression 05/14/2017  . Anxiety 05/14/2017  . Pityriasis rosea 03/17/2017  . PCOS (polycystic ovarian syndrome) 07/05/2011     Objective:  BP 125/80 (BP Location: Left Arm, Patient Position: Sitting, Cuff Size: Normal)   Pulse (!) 55   Temp 98.5 F (36.9 C) (Oral)   Ht 5\' 4"  (1.626 m)   Wt 83.5 kg (184 lb)   LMP 04/29/2017   SpO2 99%   BMI 31.58 kg/m  Vitals and nursing note reviewed  General: NAD, pleasant Cardiac: RRR, normal heart sounds, no murmurs Respiratory: CTAB, normal effort Abdomen: soft, nontender, nondistended. Bowel sounds  present Extremities: no edema or cyanosis. WWP. Skin: warm and dry, no rashes noted, ance noted on face Neuro: alert and oriented, no focal deficits  Depression screen [facility] 2/9 05/15/2017 05/14/2017 03/17/2017  Decreased Interest 2 2 2   Down, Depressed, Hopeless 2 1 1   PHQ - 2 Score 4 3 3   Altered sleeping 3 - 3  Tired, decreased energy 2 - 3  Change in appetite 2 - 2  Feeling bad or failure about yourself  3 - 2  Trouble concentrating 2 - 3  Moving slowly or fidgety/restless 3 - 1  Suicidal thoughts 1 - 1  PHQ-9 Score 20 - 18  Difficult doing work/chores Somewhat difficult - -     Assessment & Plan:    Depression Patient has tried Zoloft and Prozac, but felt most of her success came from appropriate counseling. She experienced some suicidal ideation on both treatments before and did not feel as if they significantly helped her.   Will start her on Wellbutrin today 100mg  daily for 1 week and then increase to 2 daily after that. She will come back in 6-8 weeks. Her PHQ9 today is 20. I have also put in a referral for her to behavioral health She denies any suicidal ideation at this time, but I have told her to call or come in or go to the ED if she experiences any of these thoughts or develops plans after starting the new medication   Anxiety See above problem of depression for  treatment as patient as concomitant anxiety and depression. Appears to be more a depressive aspect that is most troubling at this time.   PCOS (polycystic ovarian syndrome) I have refilled her current OCP as she is happy with this    [place] Koni Kannan, DO Family Medicine Resident PGY-1

## 2017-05-15 ENCOUNTER — Encounter: Payer: Self-pay | Admitting: Family Medicine

## 2017-05-16 ENCOUNTER — Telehealth: Payer: Self-pay | Admitting: Family Medicine

## 2017-05-16 MED ORDER — BUPROPION HCL ER (SR) 100 MG PO TB12: 100.0000 mg | ORAL_TABLET | Freq: Two times a day (BID) | ORAL | 0 refills | Status: DC

## 2017-05-16 MED ORDER — NORETHIN ACE-ETH ESTRAD-FE 1.5-30 MG-MCG PO TABS: 1.0000 | ORAL_TABLET | Freq: Every day | ORAL | 11 refills | Status: DC

## 2017-05-16 NOTE — Addendum Note (Signed)
Addended by: Henri MedalHARTSELL, JAZMIN M on: 05/16/2017 02:05 PM   Modules accepted: Orders

## 2017-05-16 NOTE — Telephone Encounter (Signed)
Medication resent to pharmacy.  It looks like it was originally ordered as an "in-house" medication when it should have been sent to the pharmacy.  They will contact patient when this is ready for pick up. Jazmin Hartsell,CMA

## 2017-05-16 NOTE — Telephone Encounter (Signed)
Pt called and said the pharmacy never received the 2 medications that were sent in on 1/23. Please advise

## 2017-05-18 ENCOUNTER — Encounter: Payer: Self-pay | Admitting: Family Medicine

## 2017-05-18 DIAGNOSIS — Z0000 Encounter for general adult medical examination without abnormal findings: Secondary | ICD-10-CM | POA: Insufficient documentation

## 2017-05-18 NOTE — Assessment & Plan Note (Signed)
I have refilled her current OCP as she is happy with this

## 2017-05-18 NOTE — Assessment & Plan Note (Signed)
See above problem of depression for treatment as patient as concomitant anxiety and depression. Appears to be more a depressive aspect that is most troubling at this time.

## 2017-05-18 NOTE — Assessment & Plan Note (Signed)
Patient has tried Zoloft and Prozac, but felt most of her success came from appropriate counseling. She experienced some suicidal ideation on both treatments before and did not feel as if they significantly helped her.   Will start her on Wellbutrin today 100mg  daily for 1 week and then increase to 2 daily after that. She will come back in 6-8 weeks. Her PHQ9 today is 20. I have also put in a referral for her to behavioral health She denies any suicidal ideation at this time, but I have told her to call or come in or go to the ED if she experiences any of these thoughts or develops plans after starting the new medication

## 2017-05-20 ENCOUNTER — Other Ambulatory Visit: Payer: Self-pay | Admitting: Family Medicine

## 2017-05-20 MED ORDER — CLINDAMYCIN PHOS-BENZOYL PEROX 1-5 % EX GEL: CUTANEOUS | 2 refills | Status: AC

## 2017-05-23 DIAGNOSIS — L709 Acne, unspecified: Secondary | ICD-10-CM | POA: Insufficient documentation

## 2017-05-23 NOTE — Addendum Note (Signed)
Addended by: Keena Dinse, SwazilandJORDAN J on: 05/23/2017 02:28 PM   Modules accepted: Level of Service

## 2017-05-23 NOTE — Assessment & Plan Note (Signed)
Patient given Benzaclin gel to apply to affected areas of her face.

## 2017-06-06 ENCOUNTER — Other Ambulatory Visit: Payer: Self-pay | Admitting: Family Medicine

## 2017-06-16 ENCOUNTER — Ambulatory Visit (INDEPENDENT_AMBULATORY_CARE_PROVIDER_SITE_OTHER): Payer: BLUE CROSS/BLUE SHIELD | Admitting: Psychiatry

## 2017-06-16 ENCOUNTER — Encounter (HOSPITAL_COMMUNITY): Payer: Self-pay | Admitting: Psychiatry

## 2017-06-16 DIAGNOSIS — R4581 Low self-esteem: Secondary | ICD-10-CM | POA: Diagnosis not present

## 2017-06-16 DIAGNOSIS — F331 Major depressive disorder, recurrent, moderate: Secondary | ICD-10-CM

## 2017-06-16 DIAGNOSIS — F4010 Social phobia, unspecified: Secondary | ICD-10-CM | POA: Diagnosis not present

## 2017-06-16 DIAGNOSIS — Z79899 Other long term (current) drug therapy: Secondary | ICD-10-CM

## 2017-06-16 DIAGNOSIS — F4312 Post-traumatic stress disorder, chronic: Secondary | ICD-10-CM

## 2017-06-16 DIAGNOSIS — Z818 Family history of other mental and behavioral disorders: Secondary | ICD-10-CM

## 2017-06-16 DIAGNOSIS — Z62810 Personal history of physical and sexual abuse in childhood: Secondary | ICD-10-CM

## 2017-06-16 DIAGNOSIS — Z62811 Personal history of psychological abuse in childhood: Secondary | ICD-10-CM

## 2017-06-16 MED ORDER — CITALOPRAM HYDROBROMIDE 20 MG PO TABS: ORAL_TABLET | ORAL | 0 refills | Status: DC

## 2017-06-16 MED ORDER — BUPROPION HCL ER (XL) 150 MG PO TB24: 150.0000 mg | ORAL_TABLET | ORAL | 0 refills | Status: DC
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
## 2017-06-17 ENCOUNTER — Ambulatory Visit: Payer: Self-pay | Admitting: Family Medicine

## 2017-06-26 ENCOUNTER — Ambulatory Visit: Payer: Self-pay | Admitting: Family Medicine

## 2017-07-12 ENCOUNTER — Telehealth (HOSPITAL_COMMUNITY): Payer: Self-pay

## 2017-07-12 NOTE — Telephone Encounter (Signed)
Patient is calling because she does not feel like the Wellbutrin is working. She said she thought it was at first, but this week has not been good. Please review and advise, she is wanting to know if you want to increase the dose.

## 2017-07-14 ENCOUNTER — Ambulatory Visit: Payer: BLUE CROSS/BLUE SHIELD | Admitting: Psychology

## 2017-07-14 DIAGNOSIS — F331 Major depressive disorder, recurrent, moderate: Secondary | ICD-10-CM

## 2017-07-14 NOTE — Telephone Encounter (Signed)
She should be currently on celexa 20 mg daily and wellbutrin 150 mg Xl.  If she has not yet increased celexa, then she should do so.  Otherwise, If she is not having side effects with celexa 20 mg, she can increase celexa to 40 mg daily (okay to take 2 tablets until she runs out)

## 2017-07-18 NOTE — Telephone Encounter (Signed)
I called the patient and left a voicemail with the directions laid out by Dr. Rene KocherEksir and told patient to call me with any questions

## 2017-07-23 ENCOUNTER — Ambulatory Visit: Payer: BLUE CROSS/BLUE SHIELD | Admitting: Psychology

## 2017-07-23 DIAGNOSIS — F331 Major depressive disorder, recurrent, moderate: Secondary | ICD-10-CM | POA: Diagnosis not present

## 2017-07-28 ENCOUNTER — Other Ambulatory Visit: Payer: Self-pay

## 2017-07-28 ENCOUNTER — Encounter: Payer: Self-pay | Admitting: Internal Medicine

## 2017-07-28 ENCOUNTER — Ambulatory Visit: Payer: BLUE CROSS/BLUE SHIELD | Admitting: Internal Medicine

## 2017-07-28 VITALS — BP 110/70 | HR 82 | Temp 98.4°F | Ht 64.0 in | Wt 180.0 lb

## 2017-07-28 DIAGNOSIS — R1032 Left lower quadrant pain: Secondary | ICD-10-CM

## 2017-07-28 NOTE — Progress Notes (Signed)
   Subjective:    Tracey Hernandez - 22 y.o. female MRN [id number]  Date of birth: [birth year]  HPI  Tracey Hernandez is here for left lower quadrant pain. Has a history of PCOS. Reports over >1 month of LLQ localized pain. Pain is deep and achy in nature. Essentially constant. She denies vaginal discharge, vomiting, fevers, pelvic pain, or AUB. She reports she was having problems with constipation recently but adjusted her diet and this resolved. Denies melena, hematochezia, and diarrhea. Denies dysuria, urinary frequency or urgency. Has been followed by Ob-GYN in the past but has not yet established with specialist in the area since moving here from [place]. History of appendectomy. No other abdominal surgeries.    -  reports that she has never smoked. She has never used smokeless tobacco. - Review of Systems: Per HPI. - Past Medical History: Patient Active Problem List   Diagnosis Date Noted  . Acne 05/23/2017  . Healthcare maintenance 05/18/2017  . Depression 05/14/2017  . Anxiety 05/14/2017  . Pityriasis rosea 03/17/2017  . PCOS (polycystic ovarian syndrome) 07/05/2011   - Medications: reviewed and updated   Objective:   Physical Exam BP 110/70   Pulse 82   Temp 98.4 F (36.9 C) (Oral)   Ht 5\' 4"  (1.626 m)   Wt 180 lb (81.6 kg)   LMP 07/14/2017   SpO2 98%   BMI 30.90 kg/m  Gen: NAD, alert, cooperative with exam, well-appearing Abd: +BS, soft, TTP in the LLQ otherwise non-tender to palpation, no rebound or guarding  GU/GYN: Patient declined.   Assessment & Plan:   1. Abdominal pain, left lower quadrant Currently most suspicious for ovarian cyst as etiology of pain given chronic localized pain with history of PCOS. Less concerned for ovarian torsion given chronicity of pain and lack of significant severity. No symptoms to suggest PID as etiology and patient declined further testing at present. Patient with recent menstrual period and does not want Upreg collected today. No history to  suggest this is related to bladder or bowel as etiology although could consider constipation given recent history and LLQ location. Will obtain pelvic ultrasound to evaluate for ovarian etiology. Recommended that patient follow up with PCP.  - US PELVIC COMPLETE WITH TRANSVAGINAL; Future   Marcy Sirenatherine Maurice Ramseur, D.O. 07/28/2017, 3:02 PM PGY-3, [facility]

## 2017-07-28 NOTE — Patient Instructions (Addendum)
I am concerned that you may have an ovarian cyst that is causing your pain and would like to get an ultrasound to evaluate further. We will call you with the results and to discuss further management. If your pain intensifies over the night, you have fevers, or vomiting I want you to go to the hospital tonight.

## 2017-07-29 ENCOUNTER — Ambulatory Visit (HOSPITAL_COMMUNITY)
Admission: RE | Admit: 2017-07-29 | Discharge: 2017-07-29 | Disposition: A | Discharge status: Home / Self Care | Payer: BLUE CROSS/BLUE SHIELD | Source: Ambulatory Visit | Attending: Family Medicine | Admitting: Family Medicine

## 2017-07-29 DIAGNOSIS — R1032 Left lower quadrant pain: Secondary | ICD-10-CM

## 2017-07-29 DIAGNOSIS — E282 Polycystic ovarian syndrome: Secondary | ICD-10-CM | POA: Diagnosis not present

## 2017-07-29 DIAGNOSIS — N854 Malposition of uterus: Secondary | ICD-10-CM | POA: Diagnosis not present

## 2017-08-07 ENCOUNTER — Ambulatory Visit: Payer: BLUE CROSS/BLUE SHIELD | Admitting: Psychology

## 2017-08-07 DIAGNOSIS — F331 Major depressive disorder, recurrent, moderate: Secondary | ICD-10-CM | POA: Diagnosis not present

## 2017-08-14 ENCOUNTER — Ambulatory Visit (HOSPITAL_COMMUNITY): Payer: BLUE CROSS/BLUE SHIELD | Admitting: Psychiatry

## 2017-08-14 ENCOUNTER — Encounter (HOSPITAL_COMMUNITY): Payer: Self-pay | Admitting: Psychiatry

## 2017-08-14 VITALS — BP 108/74 | HR 66 | Ht 64.0 in | Wt 178.0 lb

## 2017-08-14 DIAGNOSIS — F4010 Social phobia, unspecified: Secondary | ICD-10-CM

## 2017-08-14 DIAGNOSIS — F4312 Post-traumatic stress disorder, chronic: Secondary | ICD-10-CM | POA: Diagnosis not present

## 2017-08-14 DIAGNOSIS — F331 Major depressive disorder, recurrent, moderate: Secondary | ICD-10-CM

## 2017-08-14 DIAGNOSIS — Z79899 Other long term (current) drug therapy: Secondary | ICD-10-CM

## 2017-08-14 MED ORDER — CITALOPRAM HYDROBROMIDE 20 MG PO TABS: 20.0000 mg | ORAL_TABLET | Freq: Every day | ORAL | 0 refills | Status: DC

## 2017-08-14 MED ORDER — BUPROPION HCL ER (XL) 300 MG PO TB24: 300.0000 mg | ORAL_TABLET | ORAL | 0 refills | Status: DC

## 2017-08-14 NOTE — Progress Notes (Signed)
BH MD/PA/NP OP Progress Note  08/14/2017 3:47 PM Tracey Hernandez  MRN:  [id number]  Chief Complaint: Doing a little better HPI: Tracey Hernandez reports interval improvement of anxiety, reduced panic attacks, and depression is better, but not completely resolved.  She continues to struggle with apathy and low energy, and feels flat.  We discussed increasing Wellbutrin to 300 mg XL and discussed the risks of this including insomnia, increased anxiety.  She would prefer this than the increase in Celexa initially because she worries about SSRI causing her affect to blunt.  She denies any acute suicidal thinking, and reports that her thoughts about death and dying have reduced from our first visit.  Patient is moving to [facility] in the fall, and understands that provider is leaving this practice around the same time.  We will continue to see each other over the next 1-2 visits and offer referrals to providers in [place] at the end of the summer.  Visit Diagnosis:    ICD-10-CM   1. Moderate episode of recurrent major depressive disorder (HCC) F33.1   2. Chronic post-traumatic stress disorder (PTSD) F43.12 buPROPion (WELLBUTRIN XL) 300 MG 24 hr tablet    citalopram (CELEXA) 20 MG tablet  3. Social anxiety disorder F40.10     Past Psychiatric History: See intake H&P for full details. Reviewed, with no updates at this time.   Past Medical History:  Past Medical History:  Diagnosis Date  . PCOS (polycystic ovarian syndrome)     Past Surgical History:  Procedure Laterality Date  . APPENDECTOMY    . TONSILLECTOMY    . WISDOM TOOTH EXTRACTION      Family Psychiatric History: See intake H&P for full details. Reviewed, with no updates at this time.   Family History:  Family History  Problem Relation Age of Onset  . Cancer Mother   . Breast cancer Mother   . Cancer Father   . Hypercholesterolemia Father   . Hypertension Father   . Kidney disease Father     Social History:  Social History    Socioeconomic History  . Marital status: Single    Spouse name: Not on file  . Number of children: Not on file  . Years of education: Not on file  . Highest education level: Not on file  Occupational History  . Occupation: Museum/gallery conservator  . Financial resource strain: Not on file  . Food insecurity:    Worry: Not on file    Inability: Not on file  . Transportation needs:    Medical: Not on file    Non-medical: Not on file  Tobacco Use  . Smoking status: Never Smoker  . Smokeless tobacco: Never Used  Substance and Sexual Activity  . Alcohol use: Yes    Comment: 2/week- socially  . Drug use: No  . Sexual activity: Yes  Lifestyle  . Physical activity:    Days per week: Not on file    Minutes per session: Not on file  . Stress: Not on file  Relationships  . Social connections:    Talks on phone: Not on file    Gets together: Not on file    Attends religious service: Not on file    Active member of club or organization: Not on file    Attends meetings of clubs or organizations: Not on file    Relationship status: Not on file  Other Topics Concern  . Not on file  Social History Narrative   Lives with boyfriend,  roommate and a cat and dog. Plays Quidditch    Allergies: No Known Allergies  Metabolic Disorder Labs: No results found for: HGBA1C, MPG No results found for: PROLACTIN No results found for: CHOL, TRIG, HDL, CHOLHDL, VLDL, LDLCALC No results found for: TSH  Therapeutic Level Labs: No results found for: LITHIUM No results found for: VALPROATE No components found for:  CBMZ  Current Medications: Current Outpatient Medications  Medication Sig Dispense Refill  . buPROPion (WELLBUTRIN XL) 300 MG 24 hr tablet Take 1 tablet (300 mg total) by mouth every morning. 90 tablet 0  . citalopram (CELEXA) 20 MG tablet Take 1 tablet (20 mg total) by mouth daily. 90 tablet 0  . clindamycin-benzoyl peroxide (BENZACLIN) gel Apply to areas of acne 2 times daily 50  g 2  . norethindrone-ethinyl estradiol-iron (BLISOVI FE 1.5/30) 1.5-30 MG-MCG tablet Take 1 tablet by mouth daily. 1 Package 11  . Vitamin D, Ergocalciferol, (DRISDOL) 50000 units CAPS capsule TAKE ONE TABLET BY MOUTH ONCE WEEKLY. FOR 12 WEEKS. 12 capsule 0   No current facility-administered medications for this visit.      Musculoskeletal: Strength & Muscle Tone: within normal limits Gait & Station: normal Patient leans: N/A  Psychiatric Specialty Exam: ROS  Blood pressure 108/74, pulse 66, height 5\' 4"  (1.626 m), weight 178 lb (80.7 kg), SpO2 98 %.Body mass index is 30.55 kg/m.  General Appearance: Casual and Fairly Groomed  Eye Contact:  Fair  Speech:  Normal Rate  Volume:  soft  Mood:  Dysphoric  Affect:  Congruent and Flat  Thought Process:  Goal Directed and Descriptions of Associations: Intact  Orientation:  Full (Time, Place, and Person)  Thought Content: Logical   Suicidal Thoughts:  Yes.  without intent/plan  Homicidal Thoughts:  No  Memory:  Immediate;   Good  Judgement:  Fair  Insight:  Fair  Psychomotor Activity:  Normal  Concentration:  Attention Span: Fair  Recall:  Good  Fund of Knowledge: Good  Language: Good  Akathisia:  Negative  Handed:  Left  AIMS (if indicated): not done  Assets:  Communication Skills Desire for Improvement Financial Resources/Insurance Housing Intimacy Physical Health Social Support Transportation Vocational/Educational  ADL's:  Intact  Cognition: WNL  Sleep:  Fair   Screenings: GAD-7     Office Visit from 03/17/2017 in [facility] Office Visit from 12/11/2016 in [facility]  Total GAD-7 Score  13  10    PHQ2-9     Office Visit from 05/14/2017 in [facility] Office Visit from 03/17/2017 in [facility] Office Visit from 12/11/2016 in [facility]  PHQ-2 Total Score  4  3  2   PHQ-9  Total Score  20  18  15        Assessment and Plan:  Tracey Hernandez continues with some depressive symptoms, largely atypical features including low energy, poor appetite.  She does feel that her anxiety and depression have somewhat improved with Celexa, and we agreed on increasing Wellbutrin to 300 mg.  I also suggested we consider an increase in Celexa to 40 mg, but she would like to make one change at a time, which I agree would be prudent.  She denies any acute suicidality but has passive thoughts about death.  She will be moving to [facility] at the end of the summer, and will transfer her care locally at that time  1. Moderate episode  of recurrent major depressive disorder (HCC)   2. Chronic post-traumatic stress disorder (PTSD)   3. Social anxiety disorder     Status of current problems: gradually improving  Labs Ordered: No orders of the defined types were placed in this encounter.   Labs Reviewed: n/a  Collateral Obtained/Records Reviewed: n/a  Plan:  Continue Celexa 20 mg daily, okay to increase to 40 mg daily if needed Wellbutrin increased dose to 300 mg XL daily rtc 8 weeks  I spent 15 minutes with the patient in direct face-to-face clinical care.  Greater than 50% of this time was spent in counseling and coordination of care with the patient.    Burnard LeighAlexander Arya Eksir, MD 08/14/2017, 3:47 PM

## 2017-08-15 ENCOUNTER — Ambulatory Visit: Payer: BLUE CROSS/BLUE SHIELD | Admitting: Psychology

## 2017-08-15 DIAGNOSIS — F331 Major depressive disorder, recurrent, moderate: Secondary | ICD-10-CM

## 2017-08-26 ENCOUNTER — Telehealth (HOSPITAL_COMMUNITY): Payer: Self-pay

## 2017-08-26 ENCOUNTER — Ambulatory Visit: Payer: BLUE CROSS/BLUE SHIELD | Admitting: Family Medicine

## 2017-08-26 ENCOUNTER — Other Ambulatory Visit: Payer: Self-pay

## 2017-08-26 ENCOUNTER — Encounter: Payer: Self-pay | Admitting: Family Medicine

## 2017-08-26 DIAGNOSIS — F419 Anxiety disorder, unspecified: Secondary | ICD-10-CM

## 2017-08-26 NOTE — Telephone Encounter (Signed)
Patient reports that she is doing well emotionally on the increase of Wellbutrin. However patient says that she is shaky - her hands visibly shake like she is cold, but she is not. She is feeling anxious. Patient would like to know if this is due to the medication, and will it last. Please review and advise, thank you

## 2017-08-26 NOTE — Progress Notes (Signed)
   Subjective:    Patient ID: Tracey Hernandez, female    DOB: 02/25/1996, 22 y.o.   MRN: [id number]   CC: Follow-up  HPI: F/u Abdominal Pain: - Patient reports that she has not had any more abdominal pain since her last visit - NSAIDs help relieve pain - [place] was grossly nomral with signs of known PCOS  Anxiety: outpatient behavioral manages patient - on Feb 25 started on wellbutrin XL  and celexa - Increased wellbutrin at last visit, but since has developed tremor - No longer feels "flat in emotions" and is happy with this medication but does not like having tremor  Smoking status reviewed  Review of Systems Per HPI   Patient Active Problem List   Diagnosis Date Noted  . Acne 05/23/2017  . Healthcare maintenance 05/18/2017  . Depression 05/14/2017  . Anxiety 05/14/2017  . Pityriasis rosea 03/17/2017  . PCOS (polycystic ovarian syndrome) 07/05/2011     Objective:  BP 116/60   Pulse 79   Temp 98.8 F (37.1 C) (Oral)   Ht  (1.626 m)   Wt 180 lb (81.6 kg)   LMP 08/05/2017 (Approximate)   SpO2 97%   BMI 30.90 kg/m  Vitals and nursing note reviewed  General: NAD, pleasant Cardiac: RRR, normal heart sounds, no murmurs Respiratory: CTAB, normal effort Extremities: no edema or cyanosis. WWP. Skin: warm and dry, no rashes noted Neuro: alert and oriented, no focal deficits Psych: normal affect  Assessment & Plan:    Anxiety Patient follows with behavioral clinic. Given the development of tremor, patient is to go back to the  daily dose of Wellbutrin XL and call her therapist to discuss this change with them.   Abdominal Pain: Patient instructed to return if she starts having abdominal pain again that is not resolved. To continue her OCP's.   Tracey Shatori Bertucci, DO Family Medicine Resident PGY-1

## 2017-08-26 NOTE — Patient Instructions (Signed)
Thank you for coming to see me today. It was a pleasure! Today we talked about:   Your normal [place] results. Please continue your Blisovi.   Your tremor. This could be due to the increase in the Wellbutrin. I suggest going back to the  daily dose and calling your therapist to discuss this change with them.   Please follow-up with me if you develop any worsening abdominal symptoms or as needed.  If you have any questions or concerns, please do not hesitate to call the office at [phone].  Take Care,   [place] Levina Boyack, DO

## 2017-08-28 ENCOUNTER — Ambulatory Visit: Payer: BLUE CROSS/BLUE SHIELD | Admitting: Psychology

## 2017-08-28 DIAGNOSIS — F331 Major depressive disorder, recurrent, moderate: Secondary | ICD-10-CM

## 2017-08-29 NOTE — Assessment & Plan Note (Signed)
Patient follows with behavioral clinic. Given the development of tremor, patient is to go back to the  daily dose of Wellbutrin XL and call her therapist to discuss this change with them.

## 2017-09-01 NOTE — Telephone Encounter (Signed)
This should gradually reduce/calm down

## 2017-09-02 NOTE — Telephone Encounter (Signed)
Called patient and lvm for her to call me back 

## 2017-09-04 ENCOUNTER — Ambulatory Visit: Payer: Self-pay | Admitting: Psychology

## 2017-09-11 ENCOUNTER — Ambulatory Visit: Payer: BLUE CROSS/BLUE SHIELD | Admitting: Psychology

## 2017-09-11 ENCOUNTER — Encounter (INDEPENDENT_AMBULATORY_CARE_PROVIDER_SITE_OTHER): Payer: Self-pay

## 2017-09-11 ENCOUNTER — Encounter (HOSPITAL_COMMUNITY): Payer: Self-pay | Admitting: Psychiatry

## 2017-09-11 ENCOUNTER — Ambulatory Visit (HOSPITAL_COMMUNITY): Payer: BLUE CROSS/BLUE SHIELD | Admitting: Psychiatry

## 2017-09-11 DIAGNOSIS — F4010 Social phobia, unspecified: Secondary | ICD-10-CM

## 2017-09-11 DIAGNOSIS — F331 Major depressive disorder, recurrent, moderate: Secondary | ICD-10-CM

## 2017-09-11 DIAGNOSIS — F4312 Post-traumatic stress disorder, chronic: Secondary | ICD-10-CM | POA: Diagnosis not present

## 2017-09-11 MED ORDER — CITALOPRAM HYDROBROMIDE 40 MG PO TABS: 40.0000 mg | ORAL_TABLET | Freq: Every day | ORAL | 0 refills | Status: DC

## 2017-09-11 MED ORDER — BUPROPION HCL ER (XL) 300 MG PO TB24: 300.0000 mg | ORAL_TABLET | ORAL | 0 refills | Status: DC

## 2017-09-11 NOTE — Progress Notes (Signed)
BH MD/PA/NP OP Progress Note  09/11/2017 4:01 PM Tracey Hernandez  MRN:  [id number]  Chief Complaint:  doing a little better  HPI: Tracey Hernandez continues to present with dysphoria, but does report that she feels more stable overall and this medication regimen has been more effective than the others she is tried in the past.  She continues to struggle with issues of intimacy and anxiety in her relationships.  She reports that her and her boyfriend continue to work on improving their communication.  She denies any suicidal thoughts or cutting, reiterates that she has scratches on her wrists and arms from her kitten.  This does look much improved and healed from our last visit.  She does not have any significant side effects from Celexa or Wellbutrin, except for some mild tremulousness which she noticed when we increase the dose of Wellbutrin.  She feels that the side effects are outweighed by the benefits and she wishes to continue the current dose.  No seizures or headaches.  She is sleeping okay at night, but it does take a little bit of time for her to settle her mind down.  I spent time with her expressing my concern that she continues to struggle with some breakthrough depression symptoms and id like to up titrate Celexa to 40 mg.  She was on board with this change and we reiterated some of the risks and benefits associated with SSRI.   She and her boyfriend will start the release at their apartment in [place] on June 10.  I spent time with her discussing a transition of her care to [facility] psychiatry outpatient clinic, and I will provide a referral to the office.  She was receptive to this and appreciative for the care.  She is welcome to follow-up here if needed if there is any difficulty in getting set up with a new provider in [place].  Visit Diagnosis:    ICD-10-CM   1. Moderate episode of recurrent major depressive disorder (HCC) F33.1   2. Chronic post-traumatic stress disorder (PTSD) F43.12 citalopram  (CELEXA) 40 MG tablet    buPROPion (WELLBUTRIN XL) 300 MG 24 hr tablet  3. Social anxiety disorder F40.10     Past Psychiatric History: See intake H&P for full details. Reviewed, with no updates at this time.   Past Medical History:  Past Medical History:  Diagnosis Date  . PCOS (polycystic ovarian syndrome)     Past Surgical History:  Procedure Laterality Date  . APPENDECTOMY    . TONSILLECTOMY    . WISDOM TOOTH EXTRACTION      Family Psychiatric History: See intake H&P for full details. Reviewed, with no updates at this time.   Family History:  Family History  Problem Relation Age of Onset  . Cancer Mother   . Breast cancer Mother   . Cancer Father   . Hypercholesterolemia Father   . Hypertension Father   . Kidney disease Father     Social History:  Social History   Socioeconomic History  . Marital status: Single    Spouse name: Not on file  . Number of children: Not on file  . Years of education: Not on file  . Highest education level: Not on file  Occupational History  . Occupation: Museum/gallery conservator  . Financial resource strain: Not on file  . Food insecurity:    Worry: Not on file    Inability: Not on file  . Transportation needs:    Medical: Not on  file    Non-medical: Not on file  Tobacco Use  . Smoking status: Never Smoker  . Smokeless tobacco: Never Used  Substance and Sexual Activity  . Alcohol use: Yes    Comment: 2/week- socially  . Drug use: No  . Sexual activity: Yes  Lifestyle  . Physical activity:    Days per week: Not on file    Minutes per session: Not on file  . Stress: Not on file  Relationships  . Social connections:    Talks on phone: Not on file    Gets together: Not on file    Attends religious service: Not on file    Active member of club or organization: Not on file    Attends meetings of clubs or organizations: Not on file    Relationship status: Not on file  Other Topics Concern  . Not on file  Social  History Narrative   Lives with boyfriend, roommate and a cat and dog. Plays Quidditch    Allergies: No Known Allergies  Metabolic Disorder Labs: No results found for: HGBA1C, MPG No results found for: PROLACTIN No results found for: CHOL, TRIG, HDL, CHOLHDL, VLDL, LDLCALC No results found for: TSH  Therapeutic Level Labs: No results found for: LITHIUM No results found for: VALPROATE No components found for:  CBMZ  Current Medications: Current Outpatient Medications  Medication Sig Dispense Refill  . buPROPion (WELLBUTRIN XL) 300 MG 24 hr tablet Take 1 tablet (300 mg total) by mouth every morning. 90 tablet 0  . citalopram (CELEXA) 40 MG tablet Take 1 tablet (40 mg total) by mouth daily. 90 tablet 0  . Multiple Vitamin (MULTIVITAMIN) tablet Take 1 tablet by mouth daily.    . norethindrone-ethinyl estradiol-iron (BLISOVI FE 1.5/30) 1.5-30 MG-MCG tablet Take 1 tablet by mouth daily. 1 Package 11  . vitamin C (ASCORBIC ACID) 500 MG tablet Take 500 mg by mouth daily.    . Vitamin D, Ergocalciferol, (DRISDOL) 50000 units CAPS capsule TAKE ONE TABLET BY MOUTH ONCE WEEKLY. FOR 12 WEEKS. 12 capsule 0  . clindamycin-benzoyl peroxide (BENZACLIN) gel Apply to areas of acne 2 times daily (Patient not taking: Reported on 09/11/2017) 50 g 2   No current facility-administered medications for this visit.      Musculoskeletal: Strength & Muscle Tone: within normal limits Gait & Station: normal Patient leans: N/A  Psychiatric Specialty Exam: ROS  There were no vitals taken for this visit.There is no height or weight on file to calculate BMI.  General Appearance: Casual and Fairly Groomed  Eye Contact:  Good  Speech:  Normal Rate  Volume:  soft  Mood:  Euthymic and okay  Affect:  Constricted  Thought Process:  Coherent, Goal Directed and Descriptions of Associations: Intact  Orientation:  Full (Time, Place, and Person)  Thought Content: Logical   Suicidal Thoughts:  No  Homicidal  Thoughts:  No  Memory:  Recent;   Fair  Judgement:  Fair  Insight:  Fair  Psychomotor Activity:  Normal  Concentration:  Concentration: Fair  Recall:  Good  Fund of Knowledge: Good  Language: Good  Akathisia:  Negative  Handed:  Right  AIMS (if indicated): not done  Assets:  Communication Skills Desire for Improvement Financial Resources/Insurance Housing Intimacy Physical Health Social Support Transportation Vocational/Educational  ADL's:  Intact  Cognition: WNL  Sleep:  Good   Screenings: GAD-7     Office Visit from 03/17/2017 in [facility] Office Visit from 12/11/2016 in  [facility]  Total GAD-7 Score  13  10    PHQ2-9     Office Visit from 05/14/2017 in [facility] Office Visit from 03/17/2017 in [facility] Office Visit from 12/11/2016 in [facility]  PHQ-2 Total Score  PHQ-9 Total Score  Assessment and Plan:  Tracey Hernandez is a 22 year old female with a psychiatric history consistent with chronic PTSD, and major depressive disorder, complicated by ongoing issues of insecure attachment.  She has multiple strengths and is an intelligent young woman, receptive to therapeutic interventions and working on her communication in her relationship.  She does not engage in any SIB and denies any suicidality.  She seems to be faring better on the current medication regimen of Celexa and Wellbutrin.  We agreed for an increase in the dose of Celexa to 40 mg and will be making a referral for her to follow-up at the [facility] psychiatry outpatient clinic to establish new care, given that she and her boyfriend have moved to [facility] as of June 10.  This will be our last visit today, unless the patient wishes to return in the future, which she would be welcome to do so.  1. Moderate episode of recurrent major depressive disorder (HCC)    2. Chronic post-traumatic stress disorder (PTSD)   3. Social anxiety disorder     Status of current problems: stable  Labs Ordered: No orders of the defined types were placed in this encounter.   Labs Reviewed: n/a  Collateral Obtained/Records Reviewed: n/a  Plan:  Continue Wellbutrin 300 mg extended release daily Continue Celexa at the increased dose of 40 mg daily Referral to [facility] psychiatry outpatient clinic for a transfer of care Patient is welcome to return to clinic if needed Encouraged her to schedule individual therapy with her resident psychiatrist at [facility]  I spent 20 minutes with the patient in direct face-to-face clinical care.  Greater than 50% of this time was spent in counseling and coordination of care with the patient.    Burnard Leigh, MD 09/11/2017, 4:01 PM

## 2017-09-17 ENCOUNTER — Ambulatory Visit: Payer: Self-pay | Admitting: Psychology

## 2017-09-22 ENCOUNTER — Telehealth (HOSPITAL_COMMUNITY): Payer: Self-pay

## 2017-09-22 NOTE — Telephone Encounter (Signed)
I sent it in to the pharmacy that day - did it not go through?  Perhaps she wants at a different pharmacy

## 2017-09-22 NOTE — Telephone Encounter (Signed)
Received a refill request for Citalopram 40mg . Patient was last seen on 09-11-17. Please advise

## 2017-09-23 NOTE — Telephone Encounter (Signed)
Called pharmacy to re-verify if Citalopram 40mg  was received, and they stated that it had been received and to disregard the refill request.

## 2017-09-26 ENCOUNTER — Ambulatory Visit: Payer: Self-pay | Admitting: Psychology

## 2018-01-15 ENCOUNTER — Telehealth (HOSPITAL_COMMUNITY): Payer: Self-pay | Admitting: *Deleted

## 2018-01-15 NOTE — Telephone Encounter (Signed)
Patient called asking for refills of Wellbutrin and Celexa. Was a patient of Dr. Rene Kocher and now living in [place] and has appointment at [facility] in November but ran out of Celexa this week. Still has some Wellbutrin left but not enough to next appointment. Asking that she get a refill to make it to her appointment at [facility]. Patient asked to call Dr. Rene Kocher at his new office but when she did she was told they could not given refills because she had not been seen at that location. She was told to call this location since this was where she was seen last.

## 2018-01-15 NOTE — Telephone Encounter (Signed)
Yes give her a 30-day supply of both medications.  No refills

## 2018-01-19 ENCOUNTER — Other Ambulatory Visit (HOSPITAL_COMMUNITY): Payer: Self-pay

## 2018-01-19 DIAGNOSIS — F4312 Post-traumatic stress disorder, chronic: Secondary | ICD-10-CM

## 2018-01-19 MED ORDER — BUPROPION HCL ER (XL) 300 MG PO TB24: 300.0000 mg | ORAL_TABLET | ORAL | 0 refills | Status: AC

## 2018-01-19 MED ORDER — CITALOPRAM HYDROBROMIDE 40 MG PO TABS: 40.0000 mg | ORAL_TABLET | Freq: Every day | ORAL | 0 refills | Status: AC

## 2018-03-17 ENCOUNTER — Encounter

## 2018-06-17 ENCOUNTER — Other Ambulatory Visit: Payer: Self-pay | Admitting: Family Medicine

## 2018-10-01 IMAGING — CT CT HEAD W/O CM
4 series · 16 of 47 positions shown, 18 images · non-contrast
Comparison: None.

CLINICAL DATA: Headache posttraumatic.  Fall today.

EXAM:
CT HEAD WITHOUT CONTRAST
TECHNIQUE: Contiguous axial images were obtained from the base of the skull
through the vertex without intravenous contrast.

[Series 3: head without · axial · non-contrast · 0.40mm/px · z∈[-122,-22]mm · 7 of 28 slices shown, 9 images]
[im 4/28  brain]
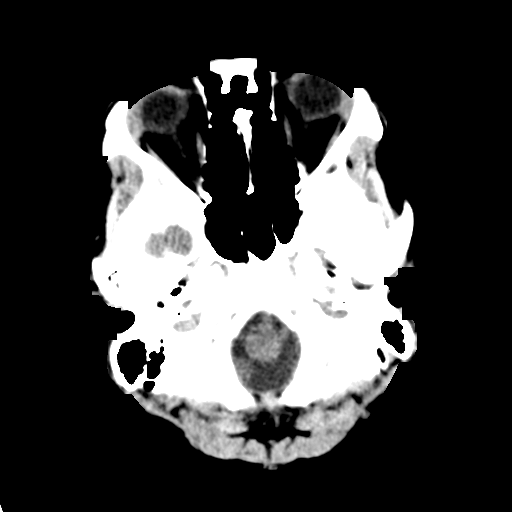
[im 4/28  bone]
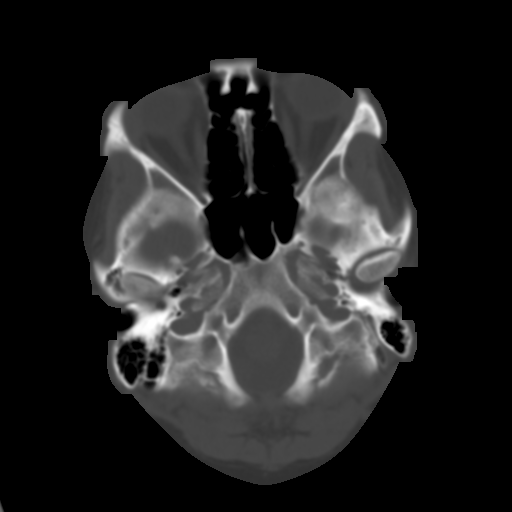
[im 7/28  brain]
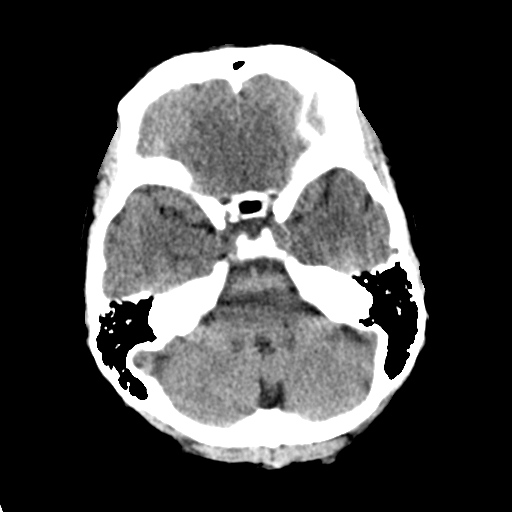
[im 11/28  brain]
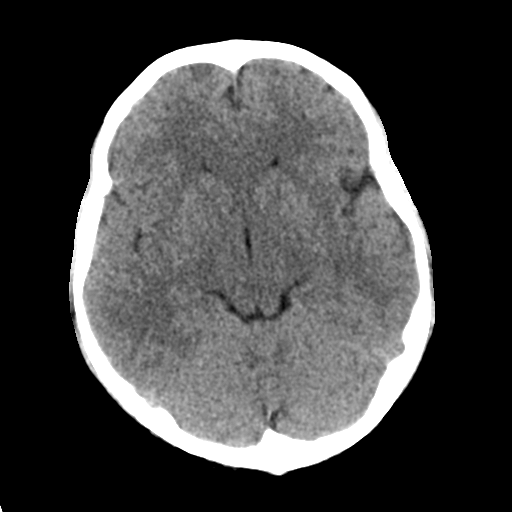
[im 14/28  brain]
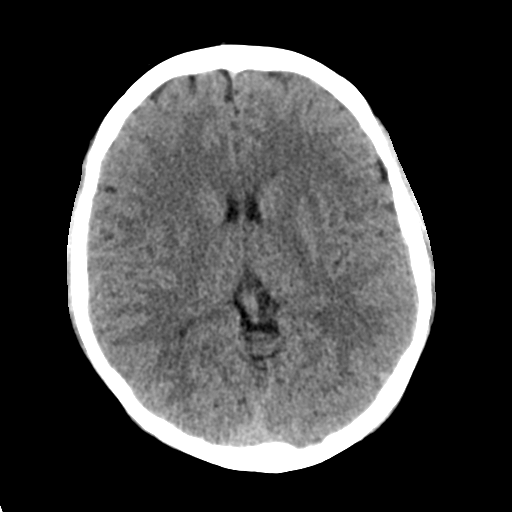
[im 17/28  brain]
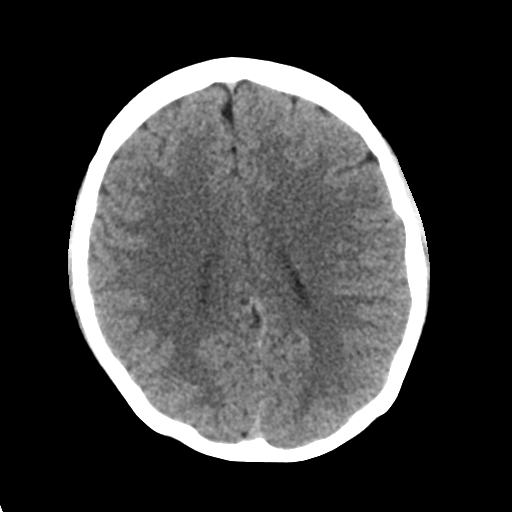
[im 17/28  bone]
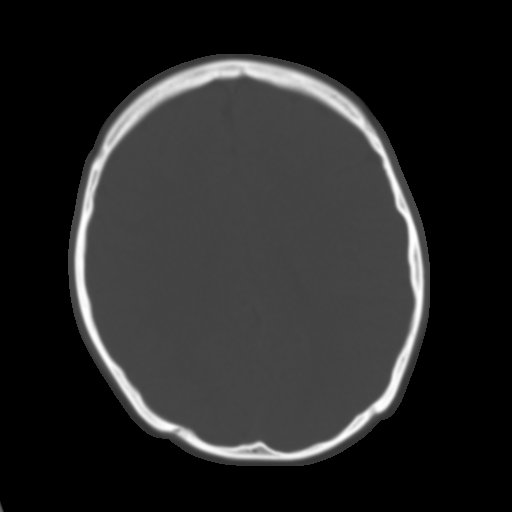
[im 21/28  brain]
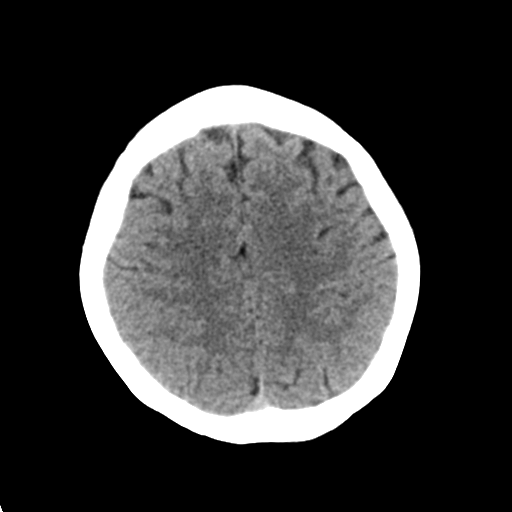
[im 24/28  brain]
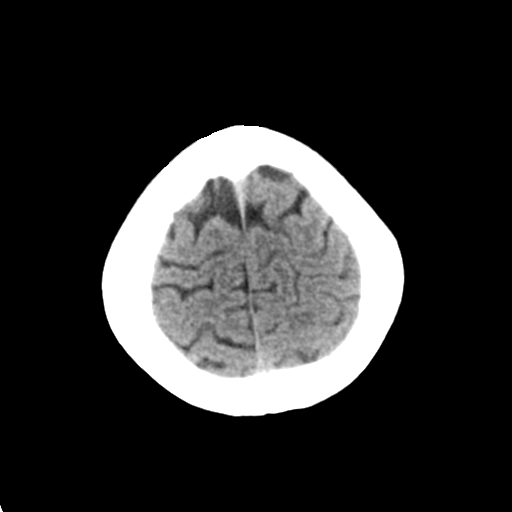

[Series 4: head bone · axial · 0.40mm/px · z∈[-124,-96]mm · 3 of 70 slices shown]
[im 7/70  bone]
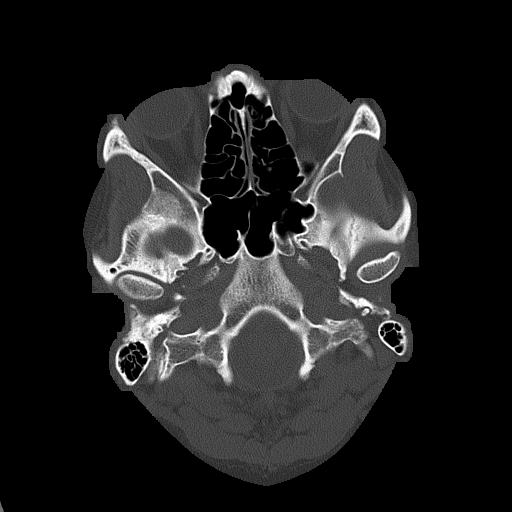
[im 14/70  bone]
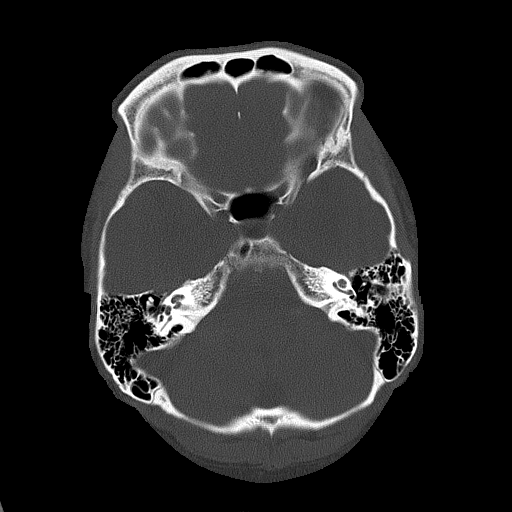
[im 21/70  bone]
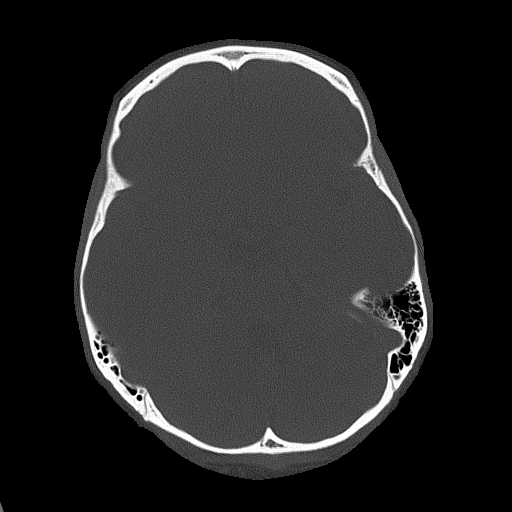

[Series 5: head without cor · coronal · non-contrast · 0.30mm/px · 3 of 60 slices shown]
[im 20/60  brain]
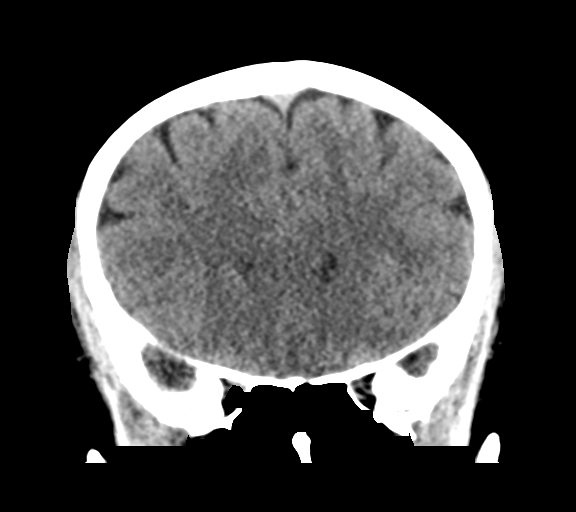
[im 27/60  brain]
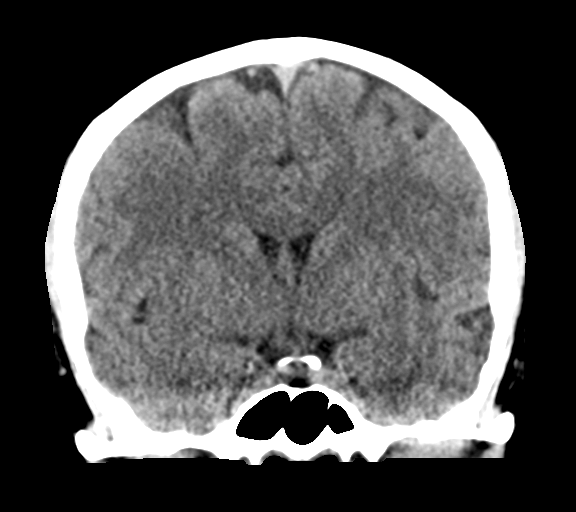
[im 33/60  brain]
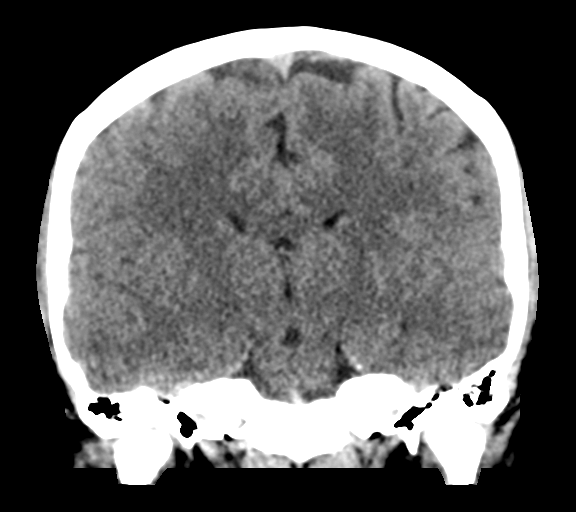

[Series 6: head without sag · sagittal · non-contrast · 0.30mm/px · 3 of 52 slices shown]
[im 18/52  brain]
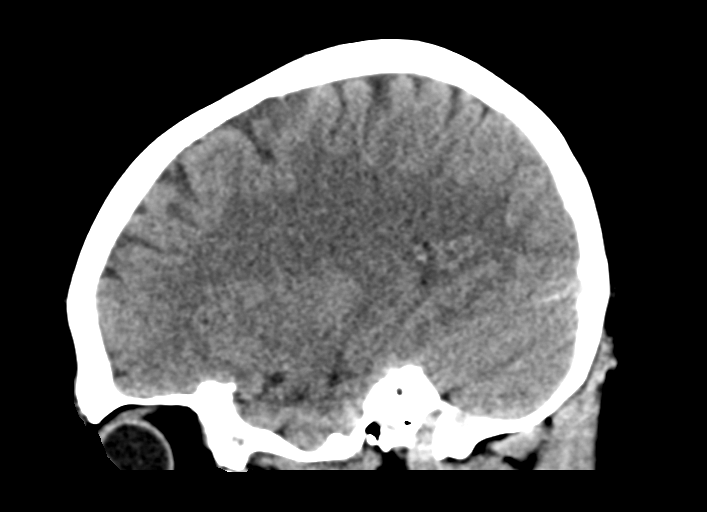
[im 26/52  brain]
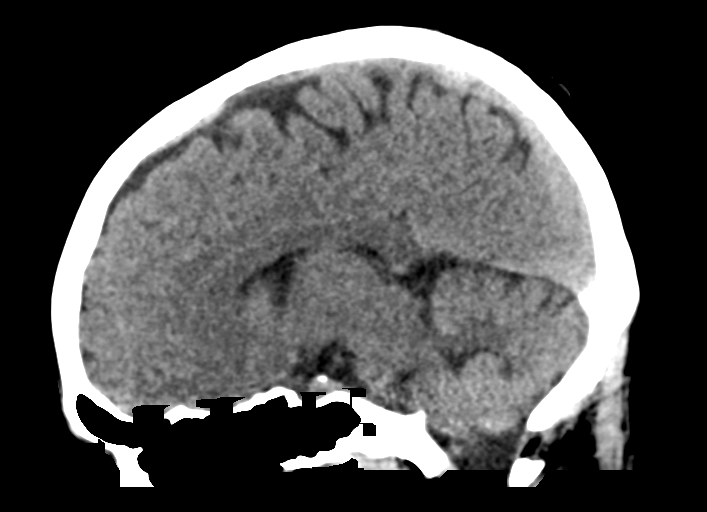
[im 35/52  brain]
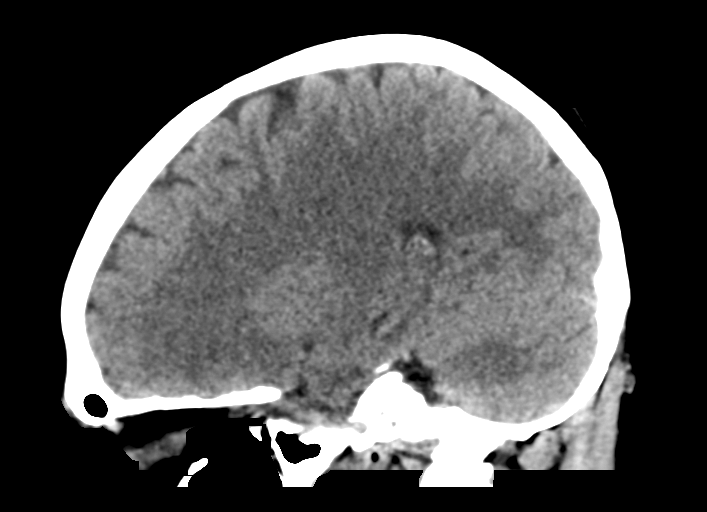

[16 of 47 positions shown; findings below may reference images not displayed]

FINDINGS: Brain: No evidence of acute infarction, hemorrhage, hydrocephalus,
extra-axial collection or mass lesion/mass effect.

Vascular: No hyperdense vessel or unexpected calcification.

Skull: Negative

Sinuses/Orbits: Negative

Other: None
IMPRESSION: Normal CT head

## 2019-06-04 IMAGING — US US PELVIS COMPLETE TRANSABD/TRANSVAG
1 series · 15 of 25 positions shown · non-contrast
Comparison: None

CLINICAL DATA: Left lower quadrant pain. Polycystic ovarian
syndrome



[Series 1: us pelvis complete transabd/transvag · 15 of 109 slices shown]
[im 1/109]
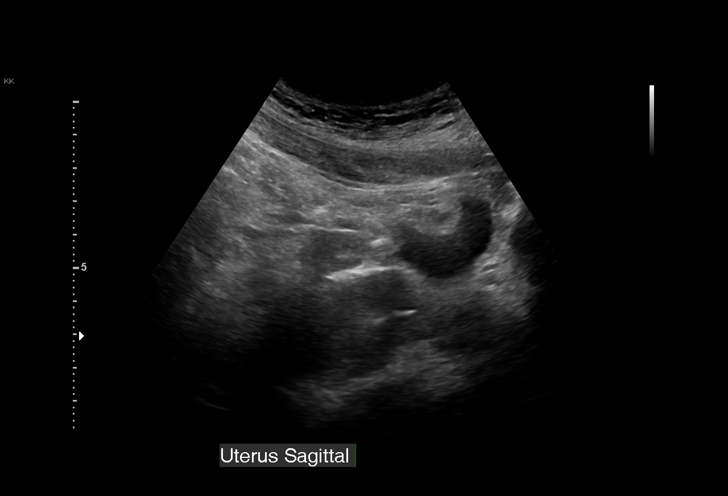
[im 10/109]
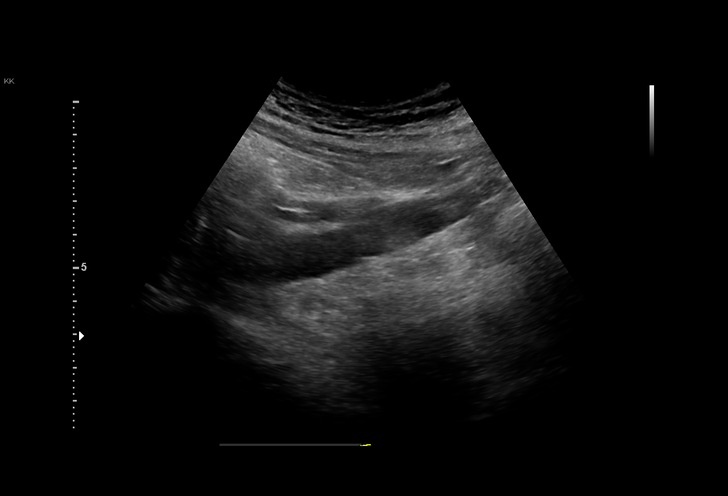
[im 19/109]
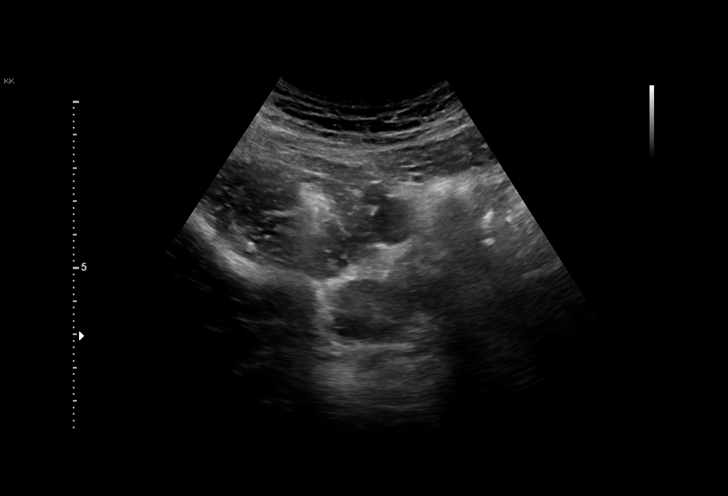
[im 23/109]
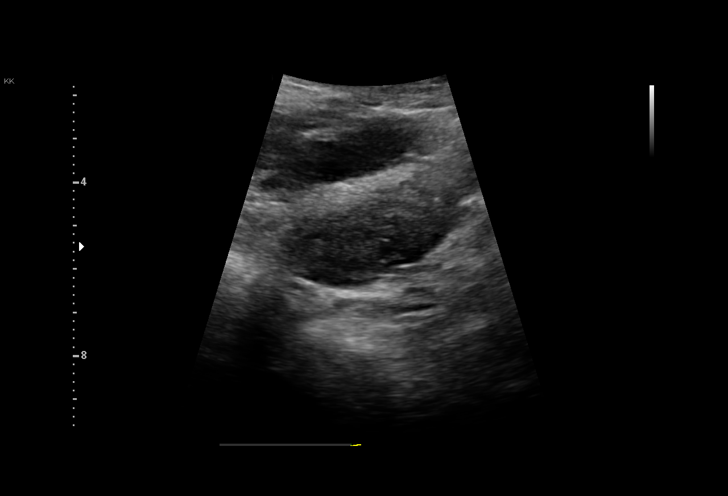
[im 32/109]
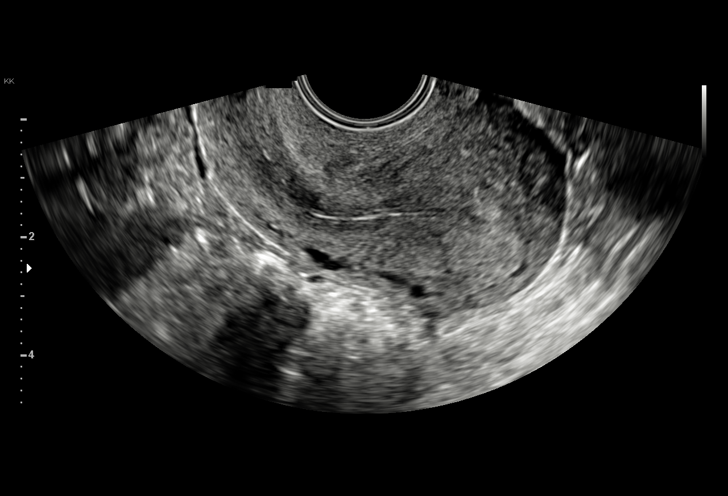
[im 41/109]
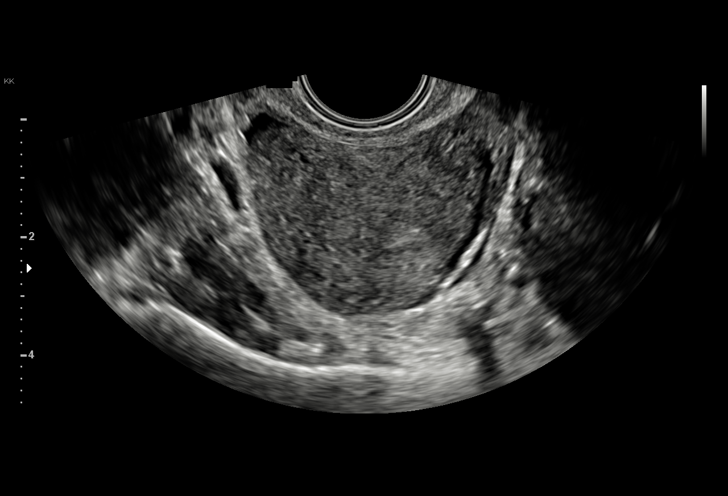
[im 46/109]
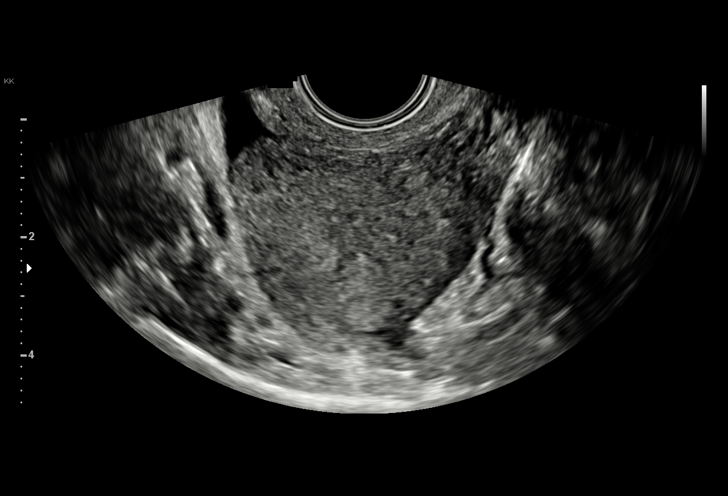
[im 55/109]
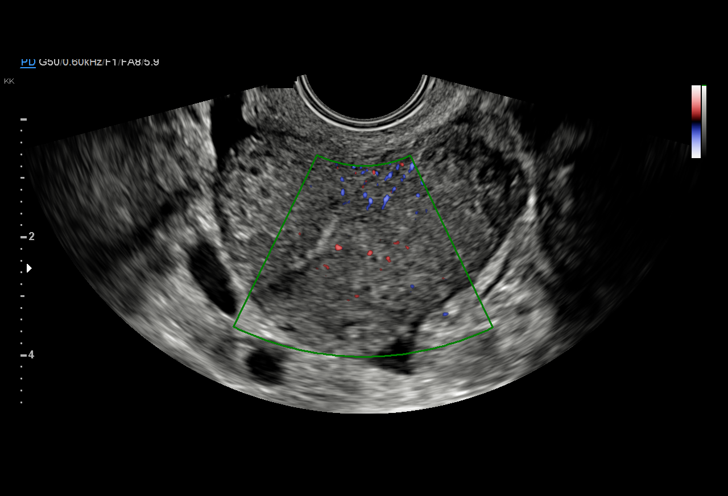
[im 64/109]
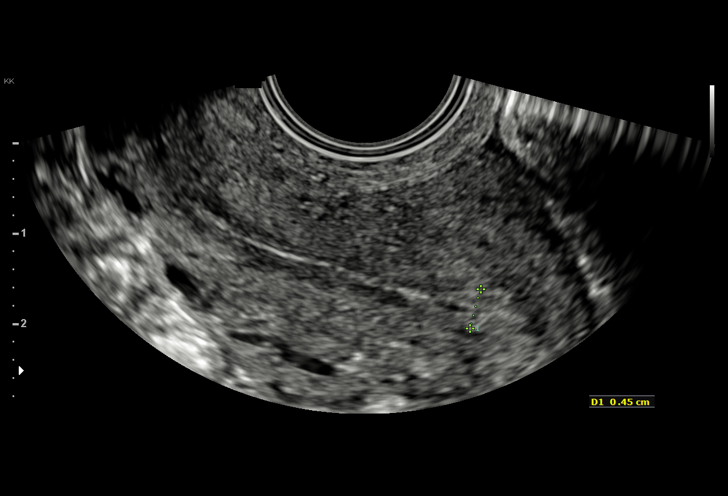
[im 68/109]
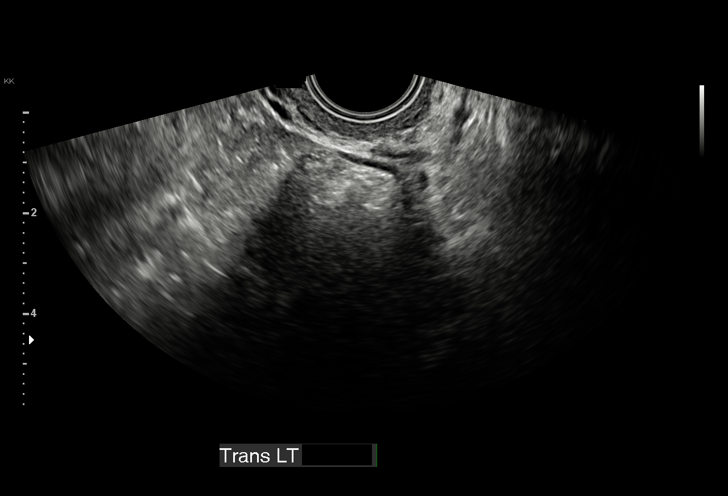
[im 77/109]
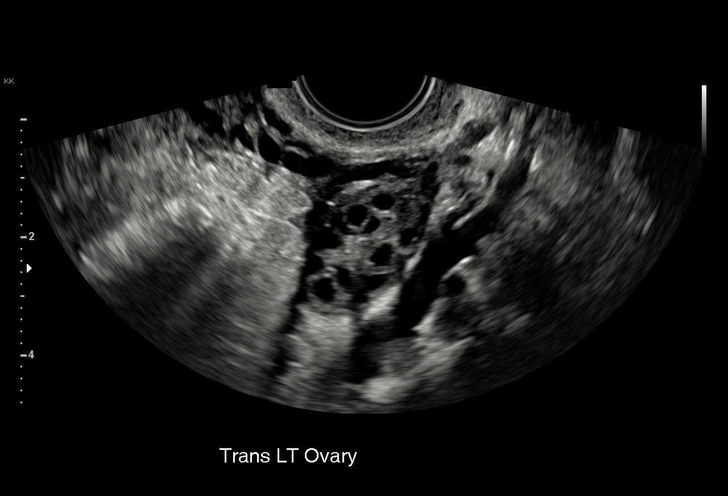
[im 86/109]
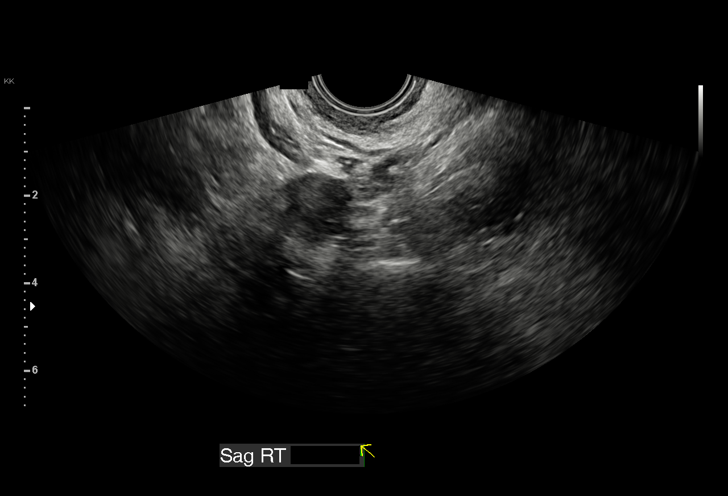
[im 91/109]
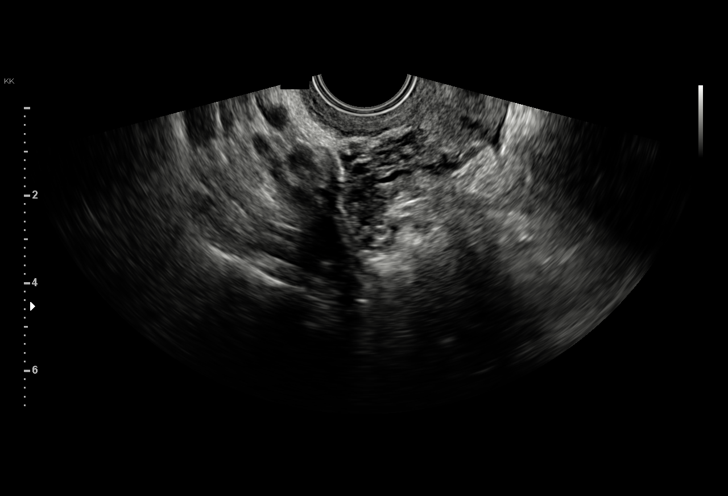
[im 100/109]
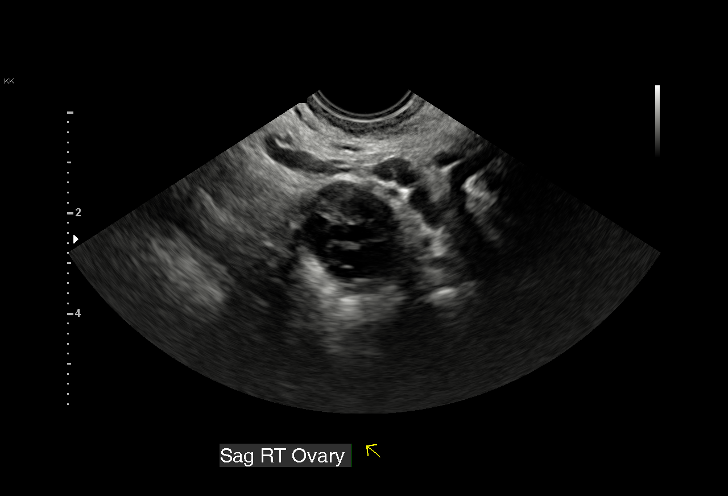
[im 109/109]
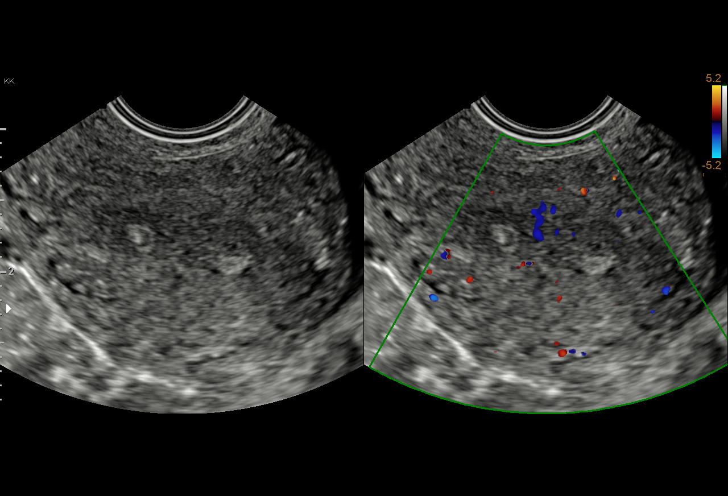

[15 of 25 positions shown; findings below may reference images not displayed]

FINDINGS: Uterus

Measurements: 7.5 x 3.8 x 4.3 cm, retroverted. No fibroids or other
mass visualized.

Endometrium

Thickness: 5 mm in thickness.  No focal abnormality visualized.

Right ovary

Measurements: 3.2 x 2.2 x 2.1 cm. Multiple small peripheral
follicles less than 1 cm. No adnexal mass.

Left ovary

Measurements: 3.3 x 1.5 x 1.6 cm. Multiple small peripheral
follicles less than 1 cm. No adnexal mass.

Other findings

No abnormal free fluid.
IMPRESSION: Multiple small peripheral follicles in the ovaries bilaterally. This
can be seen with polycystic ovarian syndrome.

Retroverted uterus.  No focal uterine abnormality.

## 2021-09-25 ENCOUNTER — Encounter: Payer: Self-pay | Admitting: *Deleted
